# Patient Record
Sex: Male | Born: 1980 | Hispanic: No | Marital: Single | Smoking: Never smoker
Health system: Southern US, Community
[De-identification: ages and names within clinical notes are randomized; demographics above are authoritative.]

## PROBLEM LIST (undated history)

## (undated) DIAGNOSIS — G479 Sleep disorder, unspecified: Secondary | ICD-10-CM

## (undated) DIAGNOSIS — F209 Schizophrenia, unspecified: Secondary | ICD-10-CM

## (undated) DIAGNOSIS — F4310 Post-traumatic stress disorder, unspecified: Secondary | ICD-10-CM

---
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
## 2004-10-19 ENCOUNTER — Ambulatory Visit (HOSPITAL_COMMUNITY): Payer: Self-pay | Admitting: Psychiatry

## 2004-11-01 ENCOUNTER — Ambulatory Visit (HOSPITAL_COMMUNITY): Payer: Self-pay | Admitting: Psychiatry

## 2004-12-03 ENCOUNTER — Ambulatory Visit (HOSPITAL_COMMUNITY): Admission: RE | Admit: 2004-12-03 | Discharge: 2004-12-03 | Payer: Self-pay | Admitting: Psychiatry

## 2004-12-03 ENCOUNTER — Ambulatory Visit (HOSPITAL_COMMUNITY): Payer: Self-pay | Admitting: Psychiatry

## 2005-01-23 ENCOUNTER — Emergency Department (HOSPITAL_COMMUNITY): Admission: EM | Admit: 2005-01-23 | Discharge: 2005-01-23 | Payer: Self-pay | Admitting: Emergency Medicine

## 2005-01-28 ENCOUNTER — Ambulatory Visit (HOSPITAL_COMMUNITY): Payer: Self-pay | Admitting: Psychiatry

## 2005-07-05 ENCOUNTER — Ambulatory Visit (HOSPITAL_COMMUNITY): Payer: Self-pay | Admitting: Psychiatry

## 2005-08-05 ENCOUNTER — Ambulatory Visit (HOSPITAL_COMMUNITY): Payer: Self-pay | Admitting: Psychiatry

## 2005-08-19 ENCOUNTER — Emergency Department (HOSPITAL_COMMUNITY): Admission: EM | Admit: 2005-08-19 | Discharge: 2005-08-19 | Payer: Self-pay | Admitting: Emergency Medicine

## 2005-09-23 ENCOUNTER — Ambulatory Visit (HOSPITAL_COMMUNITY): Payer: Self-pay | Admitting: Psychiatry

## 2005-11-27 ENCOUNTER — Ambulatory Visit (HOSPITAL_COMMUNITY): Payer: Self-pay | Admitting: Psychiatry

## 2005-12-11 ENCOUNTER — Emergency Department (HOSPITAL_COMMUNITY): Admission: EM | Admit: 2005-12-11 | Discharge: 2005-12-11 | Payer: Self-pay | Admitting: Emergency Medicine

## 2006-02-19 ENCOUNTER — Ambulatory Visit (HOSPITAL_COMMUNITY): Payer: Self-pay | Admitting: Psychiatry

## 2006-05-07 ENCOUNTER — Ambulatory Visit (HOSPITAL_COMMUNITY): Payer: Self-pay | Admitting: Psychiatry

## 2006-07-11 ENCOUNTER — Ambulatory Visit (HOSPITAL_COMMUNITY): Payer: Self-pay | Admitting: Psychiatry

## 2006-10-22 ENCOUNTER — Ambulatory Visit (HOSPITAL_COMMUNITY): Payer: Self-pay | Admitting: Psychiatry

## 2006-11-05 ENCOUNTER — Ambulatory Visit (HOSPITAL_COMMUNITY): Payer: Self-pay | Admitting: Psychiatry

## 2007-04-22 ENCOUNTER — Ambulatory Visit (HOSPITAL_COMMUNITY): Payer: Self-pay | Admitting: Psychiatry

## 2007-05-20 ENCOUNTER — Ambulatory Visit (HOSPITAL_COMMUNITY): Payer: Self-pay | Admitting: Psychiatry

## 2007-07-13 ENCOUNTER — Ambulatory Visit (HOSPITAL_COMMUNITY): Payer: Self-pay | Admitting: Psychiatry

## 2007-08-10 ENCOUNTER — Ambulatory Visit (HOSPITAL_COMMUNITY): Payer: Self-pay | Admitting: Psychiatry

## 2007-10-09 ENCOUNTER — Ambulatory Visit (HOSPITAL_COMMUNITY): Payer: Self-pay | Admitting: Psychiatry

## 2008-02-24 ENCOUNTER — Ambulatory Visit (HOSPITAL_COMMUNITY): Payer: Self-pay | Admitting: Psychiatry

## 2008-09-30 ENCOUNTER — Ambulatory Visit (HOSPITAL_COMMUNITY): Payer: Self-pay | Admitting: Psychiatry

## 2009-03-03 ENCOUNTER — Ambulatory Visit (HOSPITAL_COMMUNITY): Payer: Self-pay | Admitting: Psychiatry

## 2010-01-03 ENCOUNTER — Ambulatory Visit (HOSPITAL_COMMUNITY): Payer: Self-pay | Admitting: Psychiatry

## 2010-03-09 ENCOUNTER — Encounter (HOSPITAL_COMMUNITY): Payer: Managed Care, Other (non HMO) | Admitting: Psychiatry

## 2010-03-09 DIAGNOSIS — F39 Unspecified mood [affective] disorder: Secondary | ICD-10-CM

## 2010-05-04 ENCOUNTER — Encounter (HOSPITAL_COMMUNITY): Payer: Managed Care, Other (non HMO) | Admitting: Psychiatry

## 2010-05-11 ENCOUNTER — Encounter (HOSPITAL_COMMUNITY): Payer: Managed Care, Other (non HMO) | Admitting: Psychiatry

## 2012-05-18 ENCOUNTER — Ambulatory Visit (HOSPITAL_COMMUNITY): Payer: Managed Care, Other (non HMO) | Admitting: Psychiatry

## 2013-05-19 ENCOUNTER — Ambulatory Visit (INDEPENDENT_AMBULATORY_CARE_PROVIDER_SITE_OTHER): Payer: Self-pay

## 2013-05-19 ENCOUNTER — Other Ambulatory Visit: Payer: Self-pay | Admitting: Family Medicine

## 2013-05-19 DIAGNOSIS — R52 Pain, unspecified: Secondary | ICD-10-CM

## 2013-05-19 DIAGNOSIS — M25519 Pain in unspecified shoulder: Secondary | ICD-10-CM

## 2014-10-23 IMAGING — CR DG SHOULDER 2+V*R*
3 series · 3 of 3 positions shown · non-contrast
Comparison: None.

CLINICAL DATA: Lifting injury 2 days ago with posterior shoulder
pain extending into the neck.

EXAM:
RIGHT SHOULDER - 2+ VIEW

[view not recorded (1 of 3)]
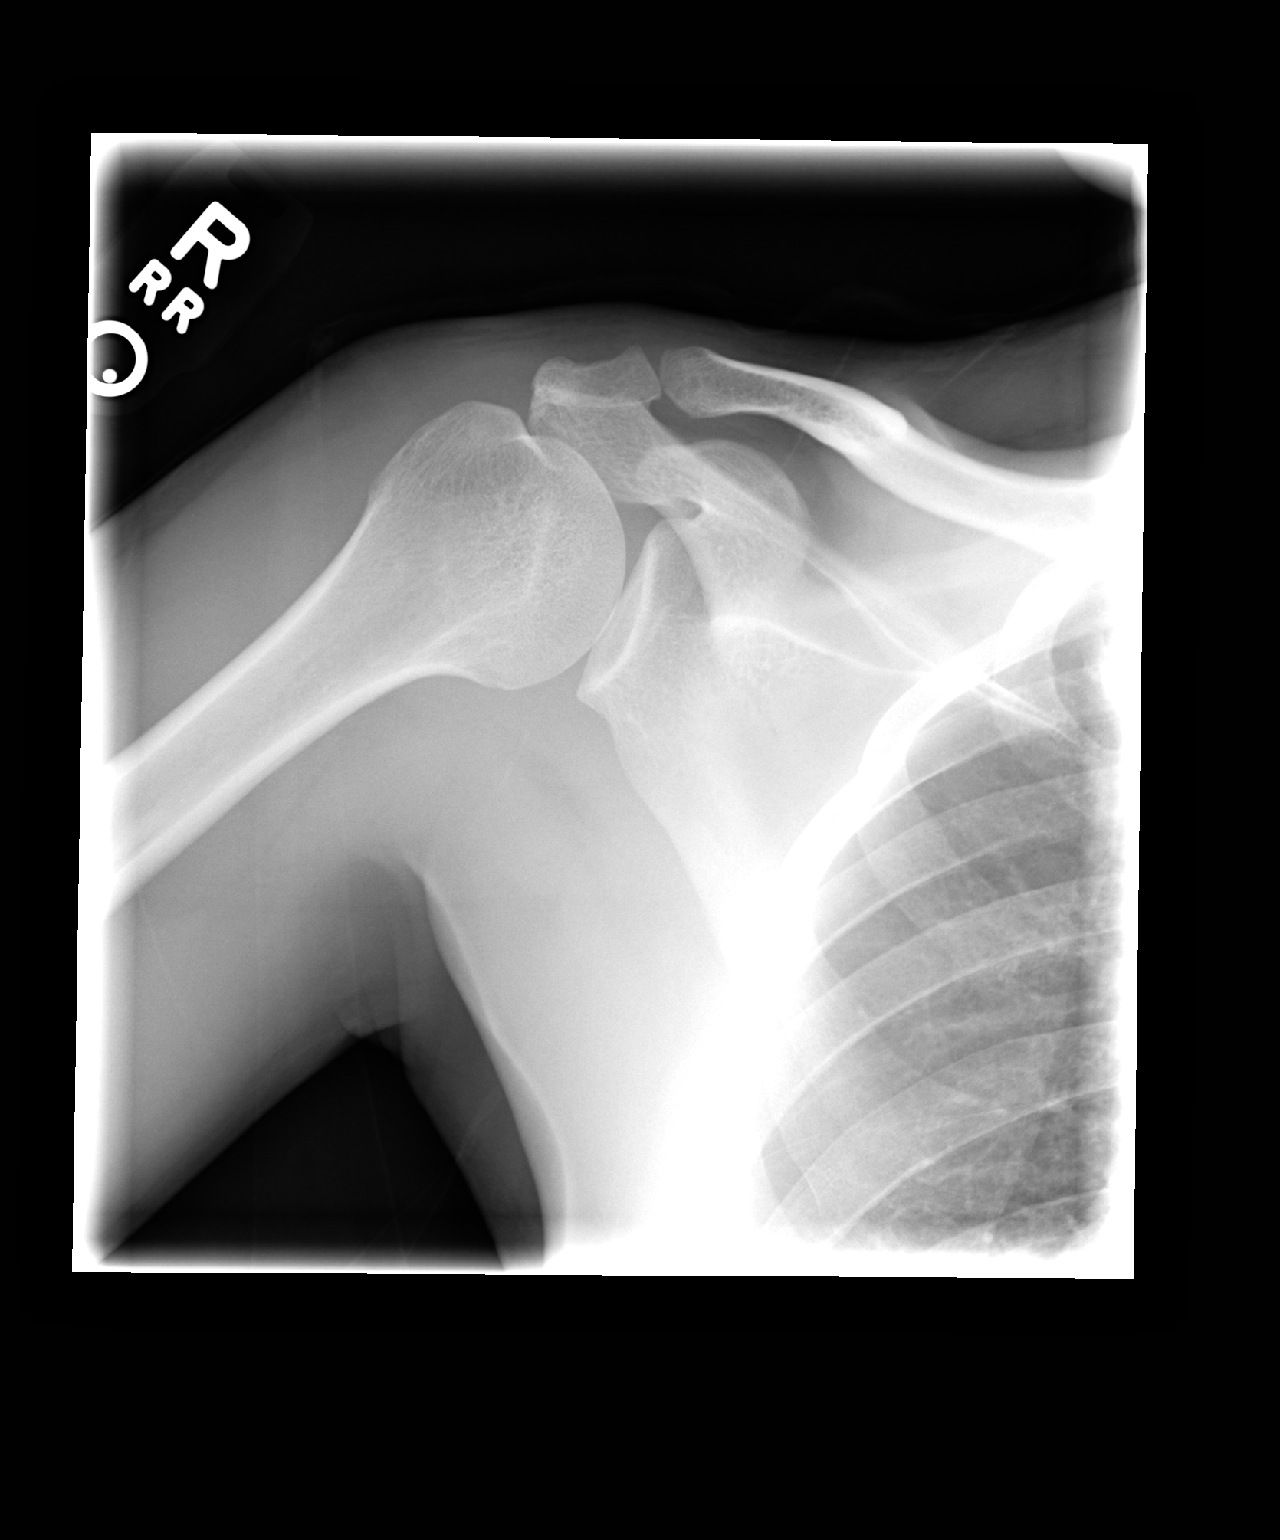

[view not recorded (2 of 3)]
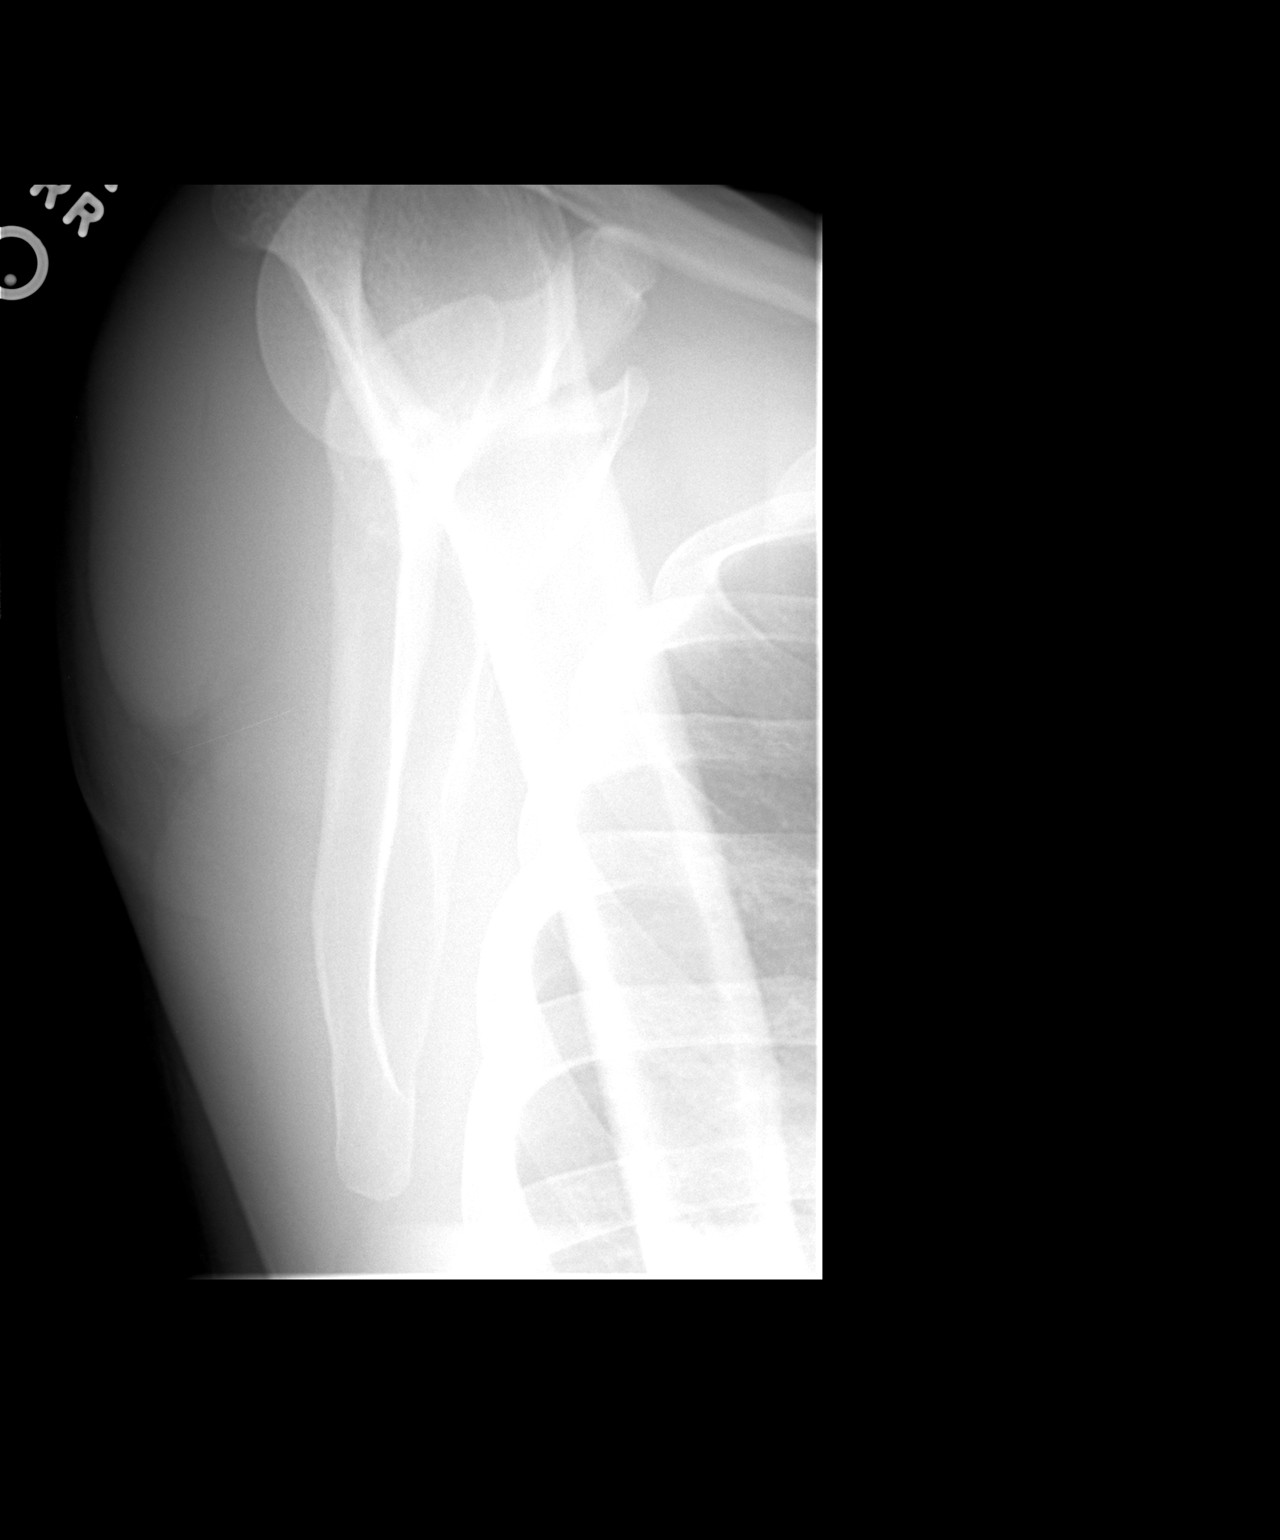

[view not recorded (3 of 3)]
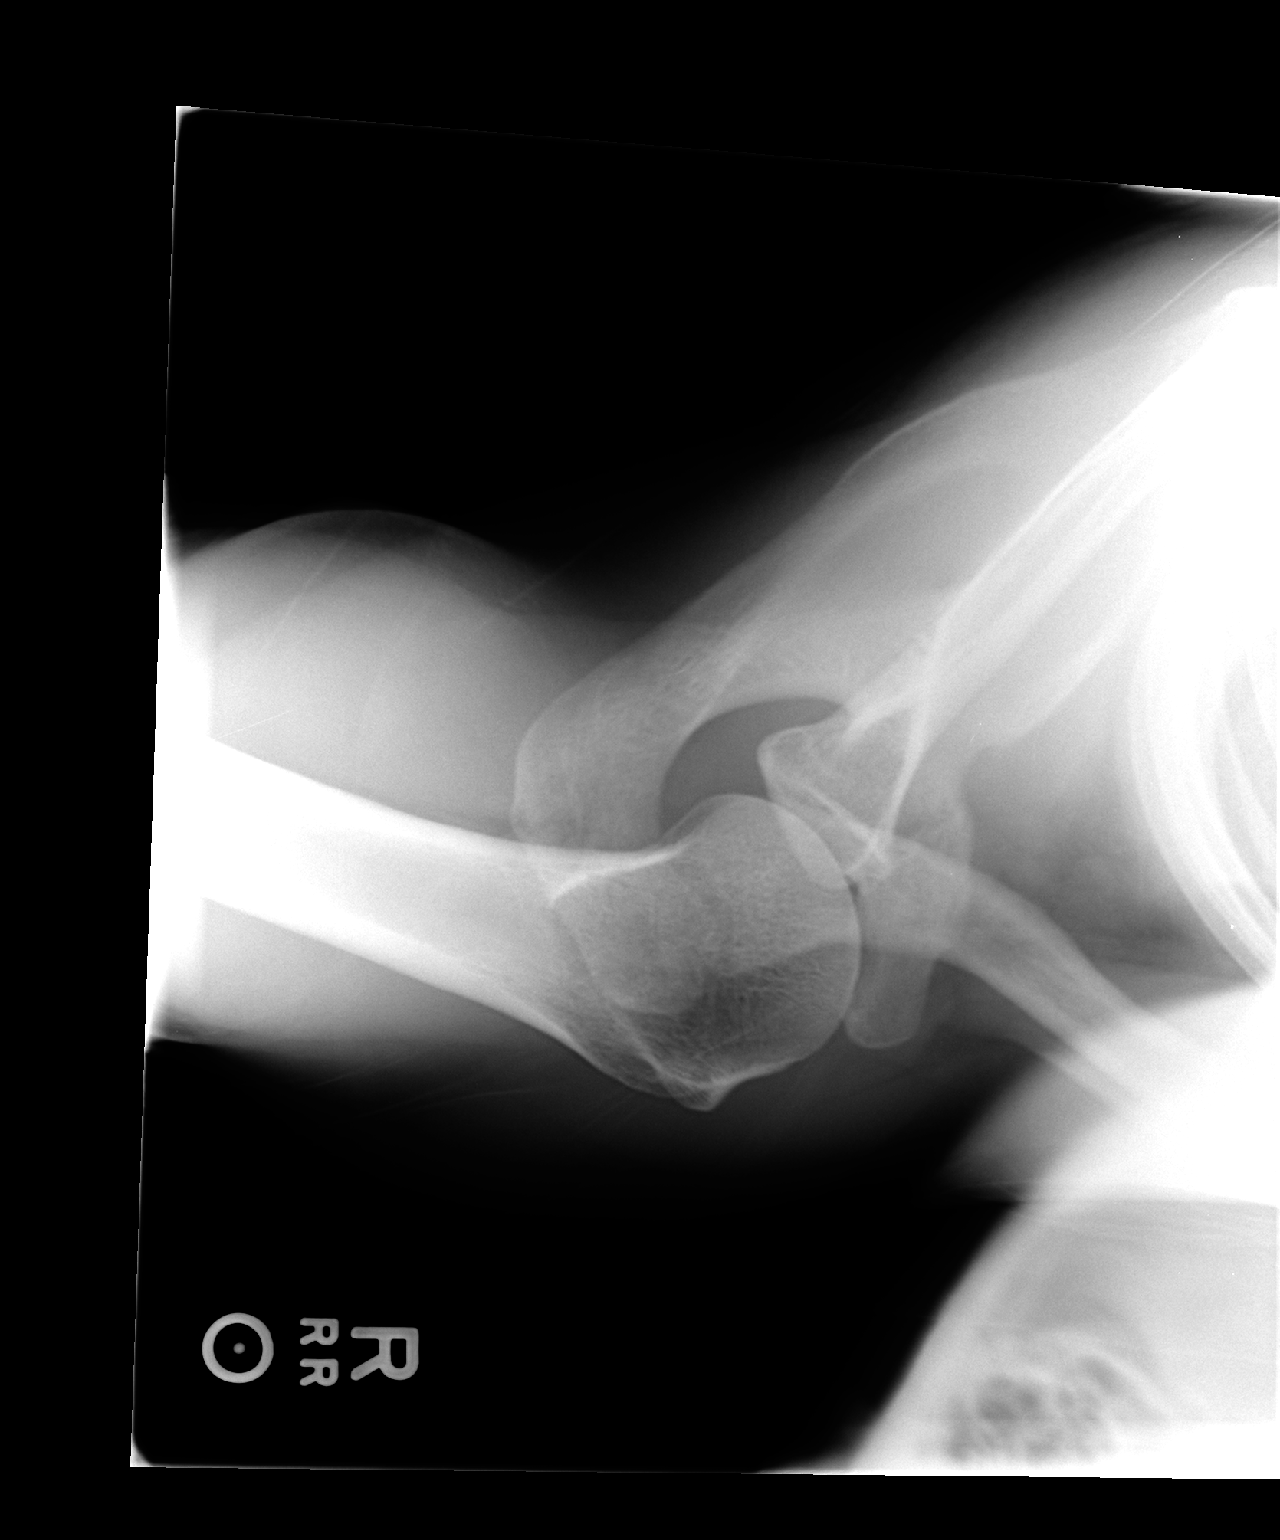

[3 of 3 positions shown; findings below may reference images not displayed]

FINDINGS: The mineralization and alignment are normal. There is no evidence of
acute fracture or dislocation. The subacromial space is not
optimally evaluated due to positioning. No significant arthropathic
changes are identified.
IMPRESSION: No acute osseous findings.

## 2015-04-03 ENCOUNTER — Inpatient Hospital Stay (HOSPITAL_COMMUNITY)
Admission: AD | Admit: 2015-04-03 | Discharge: 2015-04-06 | DRG: 885 | Disposition: A | Discharge status: Home / Self Care | Payer: Managed Care, Other (non HMO) | Attending: Psychiatry | Admitting: Psychiatry

## 2015-04-03 ENCOUNTER — Encounter (HOSPITAL_COMMUNITY): Payer: Self-pay

## 2015-04-03 DIAGNOSIS — G4700 Insomnia, unspecified: Secondary | ICD-10-CM | POA: Diagnosis present

## 2015-04-03 DIAGNOSIS — Z9114 Patient's other noncompliance with medication regimen: Secondary | ICD-10-CM

## 2015-04-03 DIAGNOSIS — F419 Anxiety disorder, unspecified: Secondary | ICD-10-CM | POA: Diagnosis present

## 2015-04-03 DIAGNOSIS — F201 Disorganized schizophrenia: Secondary | ICD-10-CM | POA: Diagnosis present

## 2015-04-03 DIAGNOSIS — F209 Schizophrenia, unspecified: Secondary | ICD-10-CM | POA: Diagnosis present

## 2015-04-03 MED ORDER — OLANZAPINE 5 MG PO TBDP
5.0000 mg | ORAL_TABLET | Freq: Three times a day (TID) | ORAL | Status: DC | PRN
  Administered 2015-04-03: 5 mg via ORAL
  Filled 2015-04-03 (×2): qty 1

## 2015-04-03 MED ORDER — MAGNESIUM HYDROXIDE 400 MG/5ML PO SUSP: 30.0000 mL | Freq: Every day | ORAL | Status: DC | PRN

## 2015-04-03 MED ORDER — TRAZODONE HCL 50 MG PO TABS
50.0000 mg | ORAL_TABLET | Freq: Every evening | ORAL | Status: DC | PRN
  Administered 2015-04-03: 50 mg via ORAL
  Filled 2015-04-03: qty 1

## 2015-04-03 MED ORDER — ACETAMINOPHEN 325 MG PO TABS: 650.0000 mg | ORAL_TABLET | Freq: Four times a day (QID) | ORAL | Status: DC | PRN

## 2015-04-03 MED ORDER — LORAZEPAM 0.5 MG PO TABS
0.5000 mg | ORAL_TABLET | Freq: Four times a day (QID) | ORAL | Status: DC | PRN
  Administered 2015-04-03: 0.5 mg via ORAL
  Filled 2015-04-03: qty 1

## 2015-04-03 MED ORDER — ALUM & MAG HYDROXIDE-SIMETH 200-200-20 MG/5ML PO SUSP: 30.0000 mL | ORAL | Status: DC | PRN

## 2015-04-03 NOTE — Progress Notes (Signed)
D: Pt A & O X 3.  Presents with blunt affect and anxious mood. Reported insomnia X 3 days and racing thoughts on initial approach. Cooperative with unit routines and care thus far. Off unit for supper and returned without issues.  A: PRN [facility] anxiety with verbal education. Encouragement offered towards treatment compliance. Availability and support provided to pt throughout this shift. Q 15 minutes checks maintained for safety without self harm gestures or outburst to report at this time.  R: Pt remains safe on and off unit. POC continues.
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
## 2015-04-03 NOTE — Tx Team (Signed)
Initial Interdisciplinary Treatment Plan   PATIENT STRESSORS: Medication change or noncompliance   PATIENT STRENGTHS: Supportive family/friends Work skills   PROBLEM LIST: Problem List/Patient Goals Date to be addressed Date deferred Reason deferred Estimated date of resolution  Paranoia/hallucinations 04/03/15     Sleep disturbance 04/03/15     Anxiety 04/03/15     "I want to sleep and eat better" 04/03/15     "I want to have my happy face back" 04/03/15                              DISCHARGE CRITERIA:  Need for constant or close observation no longer present Verbal commitment to aftercare and medication compliance  PRELIMINARY DISCHARGE PLAN: Outpatient therapy Medication management  PATIENT/FAMIILY INVOLVEMENT: This treatment plan has been presented to and reviewed with the patient, Brent [name].  The patient and family have been given the opportunity to ask questions and make suggestions.  Norm ParcelHeather V Pratik Dalziel 04/03/2015, 2:27 PM

## 2015-04-03 NOTE — BH Assessment (Signed)
Assessment Note  Brent [name] is an 35 y.o. male that reports seeing things that no one else can see.  Patient reports that he has not slept or had anything to eat in 3 days.  Patient reports that if he closes his eyes and then opens them that everything will be different.  Patient reports that things will look different and furniture will be moved.  Patient reports that if it is day when he closes his eye then it will be night when he opens his eyes.   Patient reports a previous psychiatric hospitalization at [facility].  Patient reports that he was diagnosed with Schizophrenia and his Psychiat[facility] ([facility].  Patient reports that he ran out of medication and was not able to get a refill because his Psychiatrist no longer works there.  Patient reports that he has not taken his psychiatric medication for 2 months.  Patient reports that he started feeling better and he decided that he did not need to take the medication any more.   Patient denies SI/HI/Substance Abuse.  Patient denies physical, sexual or emotional abuse.   Diagnosis: Schizophrenia  Past Medical History: No past medical history on file.  No past surgical history on file.  Family History: No family history on file.  Social History:  has no tobacco, alcohol, and drug history on file.  Additional Social History:  Alcohol / Drug Use History of alcohol / drug use?: No history of alcohol / drug abuse  CIWA:   COWS:    Allergies: Allergies not on file  Home Medications:  (Not in a hospital admission)  OB/GYN Status:  No LMP for male patient.  General Assessment Data Location of Assessment: [facility] Services ([facility] ) TTS Assessment: In system Is this a Tele or Face-to-Face Assessment?: Face-to-Face Is this an Initial Assessment or a Re-assessment for this encounter?: Initial Assessment Marital status: Married [place]iden name: NA Is patient pregnant?: No Pregnancy Status:  No Living Arrangements: Spouse/significant other Can pt return to current living arrangement?: Yes Admission Status: Voluntary Is patient capable of signing voluntary admission?: Yes Referral Source: Self/Family/Friend Insurance type: Designer, industrial/product[employer]etna  Medical Screening Exam [facility] Walk-in ONLY) Medical Exam completed:  ([facility])  Crisis Care Plan Living Arrangements: Spouse/significant other Legal Guardian:  (NA) Name of Psychiatrist: [facility] of Therapist: [facility]  Education Status Is patient currently in school?: No Current Grade: NA Highest grade of school patient has completed: NA Name of school: NA Contact person: NA  Risk to self with the past 6 months Suicidal Ideation: No Has patient been a risk to self within the past 6 months prior to admission? : No Suicidal Intent: No Has patient had any suicidal intent within the past 6 months prior to admission? : No Is patient at risk for suicide?: No Suicidal Plan?: No Has patient had any suicidal plan within the past 6 months prior to admission? : No Access to Means: No What has been your use of drugs/alcohol within the last 12 months?: NA Previous Attempts/Gestures: No How many times?: 0 Other Self Harm Risks: NA Triggers for Past Attempts: None known Intentional Self Injurious Behavior: None Family Suicide History: No Recent stressful life event(s):  (NA) Persecutory voices/beliefs?: No Depression: Yes Depression Symptoms: Despondent, Insomnia, Tearfulness, Isolating, Guilt, Fatigue, Loss of interest in usual pleasures, Feeling worthless/self pity Substance abuse history and/or treatment for substance abuse?: No Suicide prevention information given to non-admitted patients: Not  applicable  Risk to Others within the past 6 months Homicidal Ideation: No Does patient have any lifetime risk of violence toward others beyond the six months prior to admission? : No Thoughts of Harm to Others:  No Current Homicidal Intent: No Current Homicidal Plan: No Access to Homicidal Means: No Identified Victim: NA History of harm to others?: No Assessment of Violence: None Noted Violent Behavior Description: None Does patient have access to weapons?: No Criminal Charges Pending?: No Does patient have a court date: No Is patient on probation?: No  Psychosis Hallucinations: Visual Delusions: Grandiose  Mental Status Report Appearance/Hygiene: Disheveled Eye Contact: Poor Motor Activity: Freedom of movement, Restlessness Speech: Logical/coherent, Pressured Level of Consciousness: Alert, Quiet/awake, Restless Mood: Depressed, Anxious, Suspicious Affect: Anxious, Depressed, Blunted Anxiety Level: Minimal Thought Processes: Coherent, Relevant Judgement: Unimpaired Orientation: Person, Place, Time, Situation Obsessive Compulsive Thoughts/Behaviors: None  Cognitive Functioning Concentration: Decreased Memory: Remote Intact, Recent Intact IQ: Average Insight: Fair Impulse Control: Poor Appetite: Poor Weight Loss: 5 Weight Gain: 0 Sleep: Decreased Total Hours of Sleep: 1 (Has not slept in 3 days) Vegetative Symptoms: Decreased grooming, Not bathing, Staying in bed  ADLScreening [facility] Assessment Services) Patient's cognitive ability adequate to safely complete daily activities?: Yes Patient able to express need for assistance with ADLs?: Yes Independently performs ADLs?: Yes (appropriate for developmental age)  Prior Inpatient Therapy Prior Inpatient Therapy: Yes Prior Therapy Dates: 2016 Prior Therapy Facilty/Provider(s): [facility] Reason for Treatment: Psychosis  Prior Outpatient Therapy Prior Outpatient Therapy: Yes Prior Therapy Dates: Ongoing  Prior Therapy Facilty/Provider(s): [facility] Reason for Treatment: Medication Management and OPT Does patient have an ACCT team?: No Does patient have Intensive In-House Services?  : No Does patient have [facility]  services? : No Does patient have P4CC services?: No  ADL Screening (condition at time of admission) Patient's cognitive ability adequate to safely complete daily activities?: Yes Is the patient deaf or have difficulty hearing?: No Does the patient have difficulty seeing, even when wearing glasses/contacts?: No Does the patient have difficulty concentrating, remembering, or making decisions?: Yes Patient able to express need for assistance with ADLs?: Yes Does the patient have difficulty dressing or bathing?: No Independently performs ADLs?: Yes (appropriate for developmental age) Does the patient have difficulty walking or climbing stairs?: No Weakness of Legs: None Weakness of Arms/Hands: None  Home Assistive Devices/Equipment Home Assistive Devices/Equipment: None    Abuse/Neglect Assessment (Assessment to be complete while patient is alone) Physical Abuse: Denies Verbal Abuse: Denies Sexual Abuse: Denies Exploitation of patient/patient's resources: Denies Self-Neglect: Denies Values / Beliefs Cultural Requests During Hospitalization: None Spiritual Requests During Hospitalization: None Consults Spiritual Care Consult Needed: No Social Work Consult Needed: No Merchant navy officer (For Healthcare) Does patient have an advance directive?: No Would patient like information on creating an advanced directive?: No - patient declined information    Additional Information 1:1 In Past 12 Months?: No CIRT Risk: No Elopement Risk: No     Disposition: Per Vernona Rieger, patient meets criteria for inpatient hospitalization.  Per [facility] Inetta Fermo) patient accepted to [facility] Bed 502-2.  Disposition Initial Assessment Completed for this Encounter: Yes Disposition of Patient: Inpatient treatment program Type of inpatient treatment program: Adult  On Site Evaluation by:   Reviewed with Physician:    Phillip Heal LaVerne 04/03/2015 12:30 PM

## 2015-04-03 NOTE — Progress Notes (Signed)
Did not attend group 

## 2015-04-04 ENCOUNTER — Encounter (HOSPITAL_COMMUNITY): Payer: Self-pay | Admitting: Psychiatry

## 2015-04-04 DIAGNOSIS — F201 Disorganized schizophrenia: Principal | ICD-10-CM

## 2015-04-04 LAB — RAPID URINE DRUG SCREEN, HOSP PERFORMED
Amphetamines: NOT DETECTED
BARBITURATES: NOT DETECTED
Benzodiazepines: NOT DETECTED
Cocaine: NOT DETECTED
Opiates: NOT DETECTED
TETRAHYDROCANNABINOL: NOT DETECTED

## 2015-04-04 MED ORDER — ARIPIPRAZOLE 5 MG PO TABS
5.0000 mg | ORAL_TABLET | Freq: Every day | ORAL | Status: DC
  Administered 2015-04-04 – 2015-04-05 (×2): 5 mg via ORAL
  Filled 2015-04-04 (×3): qty 1

## 2015-04-04 MED ORDER — ENSURE ENLIVE PO LIQD: 237.0000 mL | Freq: Every day | ORAL | Status: DC | PRN

## 2015-04-04 MED ORDER — BENZTROPINE MESYLATE 0.5 MG PO TABS
0.5000 mg | ORAL_TABLET | Freq: Every day | ORAL | Status: DC
  Administered 2015-04-04 – 2015-04-05 (×2): 0.5 mg via ORAL
  Filled 2015-04-04 (×4): qty 1

## 2015-04-04 NOTE — [facility] Suicide Risk Assessment (Signed)
[facility] Admission Suicide Risk Assessment   Nursing information obtained from:    Demographic factors:    Current Mental Status:    Loss Factors:    Historical Factors:    Risk Reduction Factors:     Total Time spent with patient: 30 minutes Principal Problem: Schizophrenia (HCC) Diagnosis:   Patient Active Problem List   Diagnosis Date Noted  . Schizophrenia (HCC) [F20.9] 04/03/2015   Subjective Data: Patient states " I have sleep trouble and whenever I do not sleep , I have a lot of racing thoughts and I do things that I do not remember , like once I got in my car and drove all the way to [place] before realizing what I was doing and then checked myself in a hospital there."   Continued Clinical Symptoms:  Alcohol Use Disorder Identification Test Final Score (AUDIT): 0 The "Alcohol Use Disorders Identification Test", Guidelines for Use in Primary Care, Second Edition.  World Science writer [facility]). Score between 0-7:  no or low risk or alcohol related problems. Score between 8-15:  moderate risk of alcohol related problems. Score between 16-19:  high risk of alcohol related problems. Score 20 or above:  warrants further diagnostic evaluation for alcohol dependence and treatment.   CLINICAL FACTORS:   Schizophrenia:   Less than 8 years old   Musculoskeletal: Strength & Muscle Tone: within normal limits Gait & Station: normal Patient leans: N/A  Psychiatric Specialty Exam: Review of Systems  Psychiatric/Behavioral: The patient is nervous/anxious and has insomnia.   All other systems reviewed and are negative.   Blood pressure 94/56, pulse 117, temperature 98.4 F (36.9 C), temperature source Oral, resp. rate 16, height  (1.626 m), weight 56.7 kg (125 lb), SpO2 100 %.Body mass index is 21.45 kg/(m^2).  General Appearance: Fairly Groomed  Patent attorney::  Fair  Speech:  Normal Rate  Volume:  Normal  Mood:  Anxious  Affect:  Congruent  Thought Process:  Linear   Orientation:  Full (Time, Place, and Person)  Thought Content:  Rumination  Suicidal Thoughts:  No  Homicidal Thoughts:  No  Memory:  Immediate;   Fair Recent;   Fair Remote;   Fair  Judgement:  Fair  Insight:  Fair  Psychomotor Activity:  Normal  Concentration:  Poor  Recall:  [employer] of Knowledge:Fair  Language: Fair  Akathisia:  No  Handed:  Right  AIMS (if indicated):     Assets:  Desire for Improvement  Sleep:  Number of Hours: 6.75  Cognition: WNL  ADL's:  Intact    COGNITIVE FEATURES THAT CONTRIBUTE TO RISK:  Closed-mindedness, Polarized thinking and Thought constriction (tunnel vision)    SUICIDE RISK:   Mild:  Suicidal ideation of limited frequency, intensity, duration, and specificity.  There are no identifiable plans, no associated intent, mild dysphoria and related symptoms, good self-control (both objective and subjective assessment), few other risk factors, and identifiable protective factors, including available and accessible social support.  PLAN OF CARE: Patient will benefit from inpatient treatment and stabilization.  Estimated length of stay is 5-7 days.  Reviewed past medical records,treatment plan. Case discussed with May NP - please also see H&p. Will start a trial of Abilify 5 mg po qhs for mood sx. Will add Cogentin 0.5 mg po qhs for EPS. Will start Trazodone 100 mg po qhs for sleep. Will make available PRN medications as per agitation protocol. Will continue to monitor vitals ,medication compliance and treatment side effects while patient  is here.  Will monitor for medical issues as well as call consult as needed.  Reviewed labs ,will order basic labs as well as metabolic profile , UDS,EKG for qtc. CSW will start working on disposition.  Patient to participate in therapeutic milieu .       I certify that inpatient services furnished can reasonably be expected to improve the patient's condition.   Batool Majid, MD 04/04/2015, 1:31 PM

## 2015-04-04 NOTE — Progress Notes (Signed)
Patient ID: Brent [name], male   DOB: 04/27/1980, [age] y.o.   MRN: [id number] D  ---   Pt. Agrees to contract for safety and denies pain at this time.  He has isolated himself to his room most of day  And has no communication or interaction with others.  he did provide a urine sample today.  He refused to allow labs draws this AM , stating  " my blood is not right (at this time) ".    Another attemopt for labs will be made this evening.   Pt. Does not attend groups  Even though encouraged to do so.  Pt. Appears paranoid and suspicious of staff and peers.  ---  A ---   Support and encouragement provided.  --- R --  Pt. Remain safe on un it

## 2015-04-04 NOTE — Progress Notes (Signed)
NUTRITION ASSESSMENT  Pt identified as at risk on the Malnutrition Screen Tool  INTERVENTION: 1. Educated patient on the importance of nutrition and encouraged intake of food and beverages. 2. Discussed weight goals. 3. Supplements: will order Ensure Enlive once/day PRN, this supplement provides 350 kcal and 20 grams of protein   NUTRITION DIAGNOSIS: Unintentional weight loss related to sub-optimal intake as evidenced by pt report.   Goal: Pt to meet >/= 90% of their estimated nutrition needs.  Monitor:  PO intake  Assessment:  Pt admitted for anxiety, insomnia, and racing thoughts with paranoia. Pt has been eating well since admission and BMI WDL. No supplements warranted at this time but will order Ensure once/day PRN should pt need during admission.  35 y.o. male  Height: Ht Readings from Last 1 Encounters:  04/03/15 5\' 4"  (1.626 m)    Weight: Wt Readings from Last 1 Encounters:  04/03/15 125 lb (56.7 kg)    Weight Hx: Wt Readings from Last 10 Encounters:  04/03/15 125 lb (56.7 kg)    BMI:  Body mass index is 21.45 kg/(m^2). Pt meets criteria for normal weight based on current BMI.  Estimated Nutritional Needs: Kcal: 25-30 kcal/kg Protein: > 1 gram protein/kg Fluid: 1 ml/kcal  Diet Order: Diet regular Room service appropriate?: Yes; Fluid consistency:: Thin Pt is also offered choice of unit snacks mid-morning and mid-afternoon.  Pt is eating as desired.   Lab results and medications reviewed.     Trenton GammonJessica Anastashia Westerfeld, RD, LDN Inpatient Clinical Dietitian Pager # [phone] After hours/weekend pager # [phone]

## 2015-04-04 NOTE — Progress Notes (Signed)
Patient ID: [name], male   DOB: 03/17/1980, 35 y.o.   MRN: [id number]   D  ---    EKG ordered for 04/04/15 is done and posted in paper chart

## 2015-04-04 NOTE — [facility] Group Notes (Signed)
[facility] LCSW Group Therapy  04/04/2015 , 1:33 PM   Type of Therapy:  Group Therapy  Participation Level:  Active  Participation Quality:  Attentive  Affect:  Appropriate  Cognitive:  Alert  Insight:  Improving  Engagement in Therapy:  Engaged  Modes of Intervention:  Discussion, Exploration and Socialization  Summary of Progress/Problems: Today's group focused on the term Diagnosis.  Participants were asked to define the term, and then pronounce whether it is a negative, positive or neutral term.  Came late, then was called out to meet with provider.  Little time was spent in group.  While there, stated he had not been sleeping or eating at all, which is the reason for his hospitalization.  Brent [name], Brent [name] 04/04/2015 , 1:33 PM

## 2015-04-04 NOTE — [facility] Group Notes (Signed)
[facility] Group Notes:  (Nursing/MHT/Case Management/Adjunct)  Date:  04/04/2015  Time:  3:17 PM  Type of Therapy:  Psychoeducational Skills  Participation Level:  Did Not Attend  Participation Quality:  N/A  Affect:  N/A  Cognitive:  N/A  Insight:  None  Engagement in Group:  None  Modes of Intervention:  N/A  Summary of Progress/Problems:   Pt. Did not attend AM group  Arsenio LoaderHiatt, Jacky Hartung Dudley 04/04/2015, 3:17 PM

## 2015-04-04 NOTE — Progress Notes (Signed)
D. Pt had been in his room and in bed for much of the evening. Pt spoke about how he has been feeling anxious and not sleeping and having racing thoughts and discussed feelings of paranoia. Pt requested and received medications for sleep and to help with racing thoughts and was also provided snack and fluids and asked if these could be brought to his room as he felt he could not make it to the medication room on his own. A. Support provided, and medications administered. R. Safety maintained, will continue to monitor.

## 2015-04-04 NOTE — H&P (Signed)
Psychiatric Admission Assessment Adult  Patient Identification: Brent [name] MRN:  [id number] Date of Evaluation:  04/04/2015 Chief Complaint:  SCHIZOPHRENIA Principal Diagnosis: Schizophrenia (HCC) Diagnosis:   Patient Active Problem List   Diagnosis Date Noted  . Schizophrenia (HCC) [F20.9] 04/03/2015    Priority: High   History of Present Illness:  Brent [name], 35 y.o. Male reports that he has not slept or had anything to eat in 3 days. His wife was concerned and took him to the hospital.  He states that he is orginally from [place] and came [place].  "It [place] in [place].  I work hard, run, run, all the time.  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  He has also been non compliant on his meds.  He does not remember the name of his medications.  He reports that there are a lot of things on his head.    Per chart review, patient did report a previous psychiatric hospitalization at [facility]. Chart records reveal that he has a diagnosis of  Schizophrenia and being managed in the outpatient setting by  Psychiatrist (Dr. Tomasa Randunningham[facility]; that he ran out of medication and was not able to get a refill because his Psychiatrist no longer works there and that he has not taken his psychiatric medication for 2 months.   Patient denies SI/HI/Substance Abuse. Patient denies physical, sexual or emotional abuse.   Associated Signs/Symptoms: Depression Symptoms:  depressed mood, hopelessness, anxiety, (Hypo) Manic Symptoms:  Irritable Mood, Labiality of Mood, Anxiety Symptoms:  Social Anxiety, Psychotic Symptoms:  History  PTSD Symptoms: NA Total Time spent with patient: 30 minutes  Past Psychiatric History: see above noted  Is the patient at risk to self? Yes.    Has the patient been a risk to self in the past 6 months? Yes.    Has the patient been a  risk to self within the distant past? No.  Is the patient a risk to others? No.  Has the patient been a risk to others in the past 6 months? No.  Has the patient been a risk to others within the distant past? No.   Prior Inpatient Therapy: Prior Inpatient Therapy: Yes Prior Therapy Dates: 2016 Prior Therapy Facilty/Provider([facility] Reason for Treatment: Psychosis Prior Outpatient Therapy: Prior Outpatient Therapy: Yes Prior Therapy Dates: Ongoing  Prior Therapy Facilty/Provider(s): [facility] Reason for Treatment: Medication Management and OPT Does patient have an ACCT team?: No Does patient have Intensive In-House Services?  : No Does patient have [facility] services? : No Does patient have P4CC services?: No  Alcohol Screening: 1. How often do you have a drink containing alcohol?: Never 9. Have you or someone else been injured as a result of your drinking?: No 10. Has a relative or friend or a doctor or another health worker been concerned about your drinking or suggested you cut down?: No Alcohol Use Disorder Identification Test Final Score (AUDIT): 0 Brief Intervention: AUDIT score less than 7 or less-screening does not suggest unhealthy drinking-brief intervention not indicated Substance Abuse History in the last 12 months:  Yes.   Consequences of Substance Abuse: NA Previous Psychotropic Medications: Yes  Psychological Evaluations: Yes  Past Medical History: History reviewed. No pertinent past medical history. History reviewed. No pertinent past surgical history. Family History: History reviewed. No pertinent family  history. Family Psychiatric  History: denied Tobacco Screening: none Social History:  History  Alcohol Use: Not on file     History  Drug Use Not on file    Additional Social History: Marital status: Married, no children    History of alcohol / drug use?: No history of alcohol / drug abuse    Allergies:  No Known Allergies Lab Results: No  results found for this or any previous visit (from the past 48 hour(s)).  Blood Alcohol level:  No results found for: [facility]  Metabolic Disorder Labs:  No results found for: HGBA1C, MPG No results found for: PROLACTIN No results found for: CHOL, TRIG, HDL, CHOLHDL, VLDL, LDLCALC  Current Medications: Current Facility-Administered Medications  Medication Dose Route Frequency Provider Last Rate Last Dose  . acetaminophen (TYLENOL) tablet 650 mg  650 mg Oral Q6H PRN Thermon Leyland, NP      . alum & mag hydroxide-simeth (MAALOX/MYLANTA) 200-200-20 MG/5ML suspension 30 mL  30 mL Oral Q4H PRN Thermon Leyland, NP      . ARIPiprazole (ABILIFY) tablet 5 mg  5 mg Oral QHS Saramma Eappen, MD      . benztropine (COGENTIN) tablet 0.5 mg  0.5 mg Oral QHS Saramma Eappen, MD      . feeding supplement (ENSURE ENLIVE) (ENSURE ENLIVE) liquid 237 mL  237 mL Oral Daily PRN Jomarie Longs, MD      . LORazepam (ATIVAN) tablet 0.5 mg  0.5 mg Oral Q6H PRN Thermon Leyland, NP   0.5 mg at 04/03/15 1659  . magnesium hydroxide (MILK OF MAGNESIA) suspension 30 mL  30 mL Oral Daily PRN Thermon Leyland, NP      . OLANZapine zydis (ZYPREXA) disintegrating tablet 5 mg  5 mg Oral Q8H PRN Thermon Leyland, NP   5 mg at 04/03/15 2143  . traZODone (DESYREL) tablet 50 mg  50 mg Oral QHS PRN Thermon Leyland, NP   50 mg at 04/03/15 2143   PTA Medications: Prescriptions prior to admission  Medication Sig Dispense Refill Last Dose  . acetaminophen (TYLENOL) 500 MG tablet Take 1,000 mg by mouth every 6 (six) hours as needed for headache.   03/31/2015  . doxylamine, Sleep, (SLEEP AID) 25 MG tablet Take 50 mg by mouth at bedtime as needed for sleep.   04/02/2015    Musculoskeletal: Strength & Muscle Tone: within normal limits Gait & Station: normal Patient leans: N/A  Psychiatric Specialty Exam: Physical Exam  Vitals reviewed.   Review of Systems  Psychiatric/Behavioral: Positive for depression. The patient is nervous/anxious and has  insomnia.   All other systems reviewed and are negative.   Blood pressure 94/56, pulse 117, temperature 98.4 F (36.9 C), temperature source Oral, resp. rate 16, height  (1.626 m), weight 56.7 kg (125 lb), SpO2 100 %.Body mass index is 21.45 kg/(m^2).  General Appearance: Disheveled  Eye Contact::  Good  Speech:  Clear and Coherent  Volume:  Normal  Mood:  Anxious and Depressed  Affect:  Congruent and Flat  Thought Process:  Coherent  Orientation:  Full (Time, Place, and Person)  Thought Content:  Rumination  Suicidal Thoughts:  No  Homicidal Thoughts:  No  Memory:  Immediate;   Fair Recent;   Fair Remote;   Fair  Judgement:  Impaired  Insight:  Fair  Psychomotor Activity:  Normal  Concentration:  Fair  Recall:  [employer] of Knowledge:Fair  Language: Fair  Akathisia:  Negative  Handed:  Right  AIMS (if indicated):     Assets:  Resilience Social Support  ADL's:  Intact  Cognition: WNL  Sleep:  Number of Hours: 6.75   Treatment Plan Summary: Admit for crisis management and mood stabilization. Medication management to re-stabilize current mood symptoms Group counseling sessions for coping skills Medical consults as needed Review and reinstate any pertinent home medications for other health problems   Observation Level/Precautions:  15 minute checks  Laboratory:  per ED  Psychotherapy:  group  Medications:  See medlist  Consultations:  As needed  Discharge Concerns:  safety  Estimated LOS:  Other:     I certify that inpatient services furnished can reasonably be expected to improve the patient's condition.    Lindwood Qua, NP [facility] 3/21/201711:58 AM

## 2015-04-05 LAB — CBC WITH DIFFERENTIAL/PLATELET
BASOS ABS: 0 10*3/uL (ref 0.0–0.1)
Basophils Relative: 1 %
EOS PCT: 5 %
Eosinophils Absolute: 0.3 10*3/uL (ref 0.0–0.7)
HCT: 41.6 % (ref 39.0–52.0)
HEMOGLOBIN: 13.9 g/dL (ref 13.0–17.0)
LYMPHS PCT: 36 %
Lymphs Abs: 2 10*3/uL (ref 0.7–4.0)
MCH: 30.2 pg (ref 26.0–34.0)
MCHC: 33.4 g/dL (ref 30.0–36.0)
MCV: 90.4 fL (ref 78.0–100.0)
Monocytes Absolute: 0.5 10*3/uL (ref 0.1–1.0)
Monocytes Relative: 8 %
NEUTROS PCT: 50 %
Neutro Abs: 2.7 10*3/uL (ref 1.7–7.7)
PLATELETS: 196 10*3/uL (ref 150–400)
RBC: 4.6 MIL/uL (ref 4.22–5.81)
RDW: 12.4 % (ref 11.5–15.5)
WBC: 5.5 10*3/uL (ref 4.0–10.5)

## 2015-04-05 LAB — COMPREHENSIVE METABOLIC PANEL
ALT: 13 U/L — AB (ref 17–63)
AST: 15 U/L (ref 15–41)
Albumin: 4.6 g/dL (ref 3.5–5.0)
Alkaline Phosphatase: 51 U/L (ref 38–126)
Anion gap: 9 (ref 5–15)
BILIRUBIN TOTAL: 0.9 mg/dL (ref 0.3–1.2)
BUN: 20 mg/dL (ref 6–20)
CALCIUM: 9.4 mg/dL (ref 8.9–10.3)
CO2: 27 mmol/L (ref 22–32)
CREATININE: 0.76 mg/dL (ref 0.61–1.24)
Chloride: 106 mmol/L (ref 101–111)
GFR calc Af Amer: >60 mL/min (ref >60)
Glucose, Bld: 102 mg/dL — ABNORMAL HIGH (ref 65–99)
Potassium: 4 mmol/L (ref 3.5–5.1)
Sodium: 142 mmol/L (ref 135–145)
TOTAL PROTEIN: 7.1 g/dL (ref 6.5–8.1)

## 2015-04-05 LAB — ETHANOL

## 2015-04-05 LAB — LIPID PANEL
CHOL/HDL RATIO: 4 ratio
CHOLESTEROL: 173 mg/dL (ref 0–200)
HDL: 43 mg/dL (ref >40)
LDL Cholesterol: 95 mg/dL (ref 0–99)
Triglycerides: 174 mg/dL — ABNORMAL HIGH (ref <150)
VLDL: 35 mg/dL (ref 0–40)

## 2015-04-05 LAB — TSH: TSH: 0.393 u[IU]/mL (ref 0.350–4.500)

## 2015-04-05 MED ORDER — ARIPIPRAZOLE ER 400 MG IM SUSR
400.0000 mg | INTRAMUSCULAR | Status: DC
  Administered 2015-04-06: 400 mg via INTRAMUSCULAR

## 2015-04-05 NOTE — BHH Counselor (Signed)
Adult Comprehensive Assessment  Patient ID: Charleston Pootbuu A Coulon, male   DOB: 06/10/1980, 35 y.o.   MRN: [id number]  Information Source: Information source: Patient  Current Stressors:  Employment / Job issues: Working two jobs.  "All I do is work, work, work." Family Relationships: States his whole family lives here, as well as wife's best friend. Financial / Lack of resources (include bankruptcy): States that finances are tight.  Living/Environment/Situation:  Living Arrangements: Spouse/significant other (married 5 years) Living conditions (as described by patient or guardian): good How long has patient lived in current situation?: 3 yearsa What is atmosphere in current home: Comfortable, Supportive, Loving  Family History:  Marital status: Married Number of Years Married: 5 What types of issues is patient dealing with in the relationship?: none Are you sexually active?: Yes What is your sexual orientation?: hetero Does patient have children?: No  Childhood History:  By whom was/is the patient raised?: Both parents Description of patient's relationship with caregiver when they were a child: good Patient's description of current relationship with people who raised him/her: good Does patient have siblings?: Yes Number of Siblings: 9 Description of patient's current relationship with siblings: "I'm the oldest one.  I get along well with everytone." Did patient suffer any verbal/emotional/physical/sexual abuse as a child?: Yes ("Many bad things were happening in [place], which is why we came [place] the states.") Did patient suffer from severe childhood neglect?: No Has patient ever been sexually abused/assaulted/raped as an adolescent or adult?: No Was the patient ever a victim of a crime or a disaster?: No Witnessed domestic violence?: No Has patient been effected by domestic violence as an adult?: No  Education:  Highest grade of school patient has completed: 12 from [employer]  [employer] Currently a student?: No Name of school: NA Contact person: NA Learning disability?: No  Employment/Work Situation:   Employment situation: Employed Where is patient currently employed?: [employer] How long has patient been employed?: 13 years Patient's job has been impacted by current illness: Yes Describe how patient's job has been impacted: "Was not able to sleep, so unable to focus." What is the longest time patient has a held a job?: see above Where was the patient employed at that time?: see above Has patient ever been in the [employer] and Companymilitary?: No Are There Guns or Other Weapons in Your Home?: No  Financial Resources:   Financial resources: Income from employment Does patient have a representative payee or guardian?: No  Alcohol/Substance Abuse:   What has been your use of drugs/alcohol within the last 12 months?: denies Alcohol/Substance Abuse Treatment Hx: Denies past history Has alcohol/substance abuse ever caused legal problems?: No  Social Support System:   Patient's Community Support System: Good Describe Community Support System: wife, her friends, my family Type of faith/religion: Muslim How does patient's faith help to cope with current illness?: I pray every day.  It takes away bad things-like stress.  Leisure/Recreation:   Leisure and Hobbies: play soccer,play basketball  Strengths/Needs:   What things does the patient do well?: sports, responsible In what areas does patient struggle / problems for patient: "every morning getting up to get to work"  Discharge Plan:   Does patient have access to transportation?: Yes Will patient be returning to same living situation after discharge?: Yes Currently receiving community mental health services: No If no, would patient like referral for services when discharged?: Yes (What county?) Medical sales representative([place]) Does patient have financial barriers related to discharge medications?: No  Summary/Recommendations:   Summary  and Recommendations (to be completed by the evaluator): Ayodeji is a 35 YO married [place]-American  who is diagnosed with Schizophrenia.  He had not been taking medications for sometime, and began experiencing insomnia and racing thoughts.  He is willing to continue to take medication and see an outpatient psychiatrist.  He can benefit from crises stabilization, medication management, therapeutic milieu and referral for services.  Daryel Gerald B. 04/05/2015

## 2015-04-05 NOTE — Tx Team (Signed)
Interdisciplinary Treatment Plan Update (Adult)  Date:  04/05/2015   Time Reviewed:  3:43 PM   Progress in Treatment: Attending groups: Yes. Participating in groups:  Yes. Taking medication as prescribed:  Yes. Tolerating medication:  Yes. Family/Significant other contact made:  Yes Patient understands diagnosis:  Yes  As evidenced by seeking help with "insomnia and racing thoughts" Discussing patient identified problems/goals with staff:  Yes, see initial care plan. Medical problems stabilized or resolved:  Yes. Denies suicidal/homicidal ideation: Yes. Issues/concerns per patient self-inventory:  No. Other:  New problem(s) identified:  Discharge Plan or Barriers: see below  Reason for Continuation of Hospitalization: Medication stabilization Other; describe Racing thoughts, insomnia  Comments:  Brent [name] is an 35 y.o. male that reports seeing things that no one else can see. Patient reports that he has not slept or had anything to eat in 3 days. Patient reports that if he closes his eyes and then opens them that everything will be different. Patient reports that things will look different and furniture will be moved. Patient reports that if it is day when he closes his eye then it will be night when he opens his eyes.  Abilify trial  Estimated length of stay:  2-4 days  New goal(s):  Review of initial/current patient goals per problem list:   Review of initial/current patient goals per problem list:  1. Goal(s): Patient will participate in aftercare plan   Met: Yes   Target date: 3-5 days post admission date   As evidenced by: Patient will participate within aftercare plan AEB aftercare provider and housing plan at discharge being identified. 04/05/15:  Return home, follow up outpt    5. Goal(s): Patient will demonstrate decreased signs of psychosis  * Met: No  * Target date: 3-5 days post admission date  * As evidenced by: Patient will demonstrate  decreased frequency of AVH or return to baseline function 04/05/15:  Pt was not taking meds prior to admission.  C/O insomnia and racing thoughts          Attendees: Patient:  04/05/2015 3:43 PM   Family:   04/05/2015 3:43 PM   Physician:  Ursula Alert, MD 04/05/2015 3:43 PM   Nursing:   Gaylan Gerold, RN 04/05/2015 3:43 PM   CSW:    Roque Lias, LCSW   04/05/2015 3:43 PM   Other:  04/05/2015 3:43 PM   Other:   04/05/2015 3:43 PM   Other:  Lars Pinks, Nurse CM 04/05/2015 3:43 PM   Other:   04/05/2015 3:43 PM   Other:  Norberto Sorenson, [place]  04/05/2015 3:43 PM   Other:  04/05/2015 3:43 PM   Other:  04/05/2015 3:43 PM   Other:  04/05/2015 3:43 PM   Other:  04/05/2015 3:43 PM   Other:  04/05/2015 3:43 PM   Other:   04/05/2015 3:43 PM    Scribe for Treatment Team:   Trish Mage, 04/05/2015 3:43 PM

## 2015-04-05 NOTE — BHH Suicide Risk Assessment (Signed)
B[facility] INPATIENT:  Family/Significant Other Suicide Prevention Education  Suicide Prevention Education:  Education Completed; No one has been identified by the patient as the family member/significant other with whom the patient will be residing, and identified as the person(s) who will aid the patient in the event of a mental health crisis (suicidal ideations/suicide attempt).  With written consent from the patient, the family member/significant other has been provided the following suicide prevention education, prior to the and/or following the discharge of the patient.  The suicide prevention education provided includes the following:  Suicide risk factors  Suicide prevention and interventions  National Suicide Hotline telephone number  [facility] assessment telephone number  [facility] Emergency Assistance 911  [facility] and/or Residential Mobile Crisis Unit telephone number  Request made of family/significant other to:  Remove weapons (e.g., guns, rifles, knives), all items previously/currently identified as safety concern.    Remove drugs/medications (over-the-counter, prescriptions, illicit drugs), all items previously/currently identified as a safety concern.  The family member/significant other verbalizes understanding of the suicide prevention education information provided.  The family member/significant other agrees to remove the items of safety concern listed above. The patient did not endorse SI at the time of admission, nor did the patient c/o SI during the stay here.  SPE not required.   Daryel Geraldorth, Keiandra Sullenger B 04/05/2015, 3:53 PM

## 2015-04-05 NOTE — Progress Notes (Signed)
D. Pt had been up and visible in milieu this evening, seen interacting with peers. Pt spoke about how he is still having some feelings of paranoia but reports he feels more relaxed today and feels better today than yesterday and spoke about how he slept ok last night and feels that really was a benefit to him. Pt did receive all bedtime medications without incident and did not verbalize any complaints of pain. A. Support and encouragement provided. R. Safety maintained, will continue to monitor.

## 2015-04-05 NOTE — Progress Notes (Signed)
[facility] MD Progress Note  04/05/2015 2:19 PM Brent [name]  MRN:  [id number] Subjective: Patient states " I feel fine.'  Objective:Brent [name] is a 35 y.o.AA male ,who is married , employed, [place] , has a hx of schizophrenia , who presented with sleep issues and racing thoughts.  Patient seen and chart reviewed.Discussed patient with treatment team.  Pt seems to be tolerating medications well, denies ADRS. Pt reports anxiety, sleep as improving. Per staff - pt is less paranoid, less disorganized, has had no disruptive issues noted on the unit.      Principal Problem: Schizophrenia ([place]) Diagnosis:   Patient Active Problem List   Diagnosis Date Noted  . Schizophrenia ([place]) [F20.9] 04/03/2015   Total Time spent with patient: 20 minutes  Past Psychiatric History: Pt reports hx of schizophrenia, was being followed by Dr.Cunningham in the past. Was admitted once in a hospital in [place] 3 years ago. Pt reports he currently does not follow up with anyone.  Past Medical History:  Pt denies hx of dm, htn, thyroid disease. Family History: Denies hx of HTN, DM, heart disease , thyroid disease in family. Family History  Problem Relation Age of Onset  . Mental illness Neg Hx    Family Psychiatric  History: denies hx of mental illness. Social History: Is married, employed , denies having children. History  Alcohol Use No     History  Drug Use Not on file    Social History   Social History  . Marital Status: Single    Spouse Name: N/A  . Number of Children: N/A  . Years of Education: N/A   Social History Main Topics  . Smoking status: Never Smoker   . Smokeless tobacco: None  . Alcohol Use: No  . Drug Use: None  . Sexual Activity: Not Asked   Other Topics Concern  . None   Social History Narrative   Additional Social History:    History of alcohol / drug use?: No history of alcohol / drug abuse                    Sleep: improving  Appetite:   Fair  Current Medications: Current Facility-Administered Medications  Medication Dose Route Frequency Provider Last Rate Last Dose  . acetaminophen (TYLENOL) tablet 650 mg  650 mg Oral Q6H PRN Niel Hummer, NP      . alum & mag hydroxide-simeth (MAALOX/MYLANTA) 200-200-20 MG/5ML suspension 30 mL  30 mL Oral Q4H PRN Niel Hummer, NP      . ARIPiprazole (ABILIFY) tablet 5 mg  5 mg Oral QHS Ursula Alert, MD   5 mg at 04/04/15 2024  . [START ON 04/06/2015] ARIPiprazole SUSR 400 mg  400 mg Intramuscular Q30 days Ursula Alert, MD      . benztropine (COGENTIN) tablet 0.5 mg  0.5 mg Oral QHS Ursula Alert, MD   0.5 mg at 04/04/15 2024  . feeding supplement (ENSURE ENLIVE) (ENSURE ENLIVE) liquid 237 mL  237 mL Oral Daily PRN Ursula Alert, MD      . LORazepam (ATIVAN) tablet 0.5 mg  0.5 mg Oral Q6H PRN Niel Hummer, NP   0.5 mg at 04/03/15 1659  . magnesium hydroxide (MILK OF MAGNESIA) suspension 30 mL  30 mL Oral Daily PRN Niel Hummer, NP      . OLANZapine zydis (ZYPREXA) disintegrating tablet 5 mg  5 mg Oral Q8H PRN Niel Hummer, NP   5  mg at 04/03/15 2143  . traZODone (DESYREL) tablet 50 mg  50 mg Oral QHS PRN Niel Hummer, NP   50 mg at 04/03/15 2143    Lab Results:  Results for orders placed or performed during the hospital encounter of 04/03/15 (from the past 48 hour(s))  Urine rapid drug screen (hosp performed)not at [facility]     Status: None   Collection Time: 04/04/15  2:39 PM  Result Value Ref Range   Opiates NONE DETECTED NONE DETECTED   Cocaine NONE DETECTED NONE DETECTED   Benzodiazepines NONE DETECTED NONE DETECTED   Amphetamines NONE DETECTED NONE DETECTED   Tetrahydrocannabinol NONE DETECTED NONE DETECTED   Barbiturates NONE DETECTED NONE DETECTED    Comment:        DRUG SCREEN FOR MEDICAL PURPOSES ONLY.  IF CONFIRMATION IS NEEDED FOR ANY PURPOSE, NOTIFY LAB WITHIN 5 DAYS.        LOWEST DETECTABLE LIMITS FOR URINE DRUG SCREEN Drug Class       Cutoff  (ng/mL) Amphetamine      1000 Barbiturate      200 Benzodiazepine   836 Tricyclics       629 Opiates          300 Cocaine          300 THC              50 Performed at [facility]   CBC with Differential/Platelet     Status: None   Collection Time: 04/05/15  6:35 AM  Result Value Ref Range   WBC 5.5 4.0 - 10.5 K/uL   RBC 4.60 4.22 - 5.81 MIL/uL   Hemoglobin 13.9 13.0 - 17.0 g/dL   HCT 41.6 39.0 - 52.0 %   MCV 90.4 78.0 - 100.0 fL   MCH 30.2 26.0 - 34.0 pg   MCHC 33.4 30.0 - 36.0 g/dL   RDW 12.4 11.5 - 15.5 %   Platelets 196 150 - 400 K/uL   Neutrophils Relative % 50 %   Neutro Abs 2.7 1.7 - 7.7 K/uL   Lymphocytes Relative 36 %   Lymphs Abs 2.0 0.7 - 4.0 K/uL   Monocytes Relative 8 %   Monocytes Absolute 0.5 0.1 - 1.0 K/uL   Eosinophils Relative 5 %   Eosinophils Absolute 0.3 0.0 - 0.7 K/uL   Basophils Relative 1 %   Basophils Absolute 0.0 0.0 - 0.1 K/uL    Comment: Performed at [facility]  Lipid panel     Status: Abnormal   Collection Time: 04/05/15  6:35 AM  Result Value Ref Range   Cholesterol 173 0 - 200 mg/dL   Triglycerides 174 (H) <150 mg/dL   HDL 43 >40 mg/dL   Total CHOL/HDL Ratio 4.0 RATIO   VLDL 35 0 - 40 mg/dL   LDL Cholesterol 95 0 - 99 mg/dL    Comment:        Total Cholesterol/HDL:CHD Risk Coronary Heart Disease Risk Table                     Men   Women  1/2 Average Risk   3.4   3.3  Average Risk       5.0   4.4  2 X Average Risk   9.6   7.1  3 X Average Risk  23.4   11.0        Use the calculated Patient Ratio above and the CHD Risk Table to  determine the patient's CHD Risk.        ATP III CLASSIFICATION (LDL):  <100     mg/dL   Optimal  100-129  mg/dL   Near or Above                    Optimal  130-159  mg/dL   Borderline  160-189  mg/dL   High  >190     mg/dL   Very High Performed at [facility]   Ethanol     Status: None   Collection Time: 04/05/15  6:35 AM  Result Value Ref Range    Alcohol, Ethyl (B) <5 <5 mg/dL    Comment:        LOWEST DETECTABLE LIMIT FOR SERUM ALCOHOL IS 5 mg/dL FOR MEDICAL PURPOSES ONLY Performed at [facility]   TSH     Status: None   Collection Time: 04/05/15  6:35 AM  Result Value Ref Range   TSH 0.393 0.350 - 4.500 uIU/mL    Comment: Performed at [facility]  Comprehensive metabolic panel     Status: Abnormal   Collection Time: 04/05/15  6:35 AM  Result Value Ref Range   Sodium 142 135 - 145 mmol/L   Potassium 4.0 3.5 - 5.1 mmol/L   Chloride 106 101 - 111 mmol/L   CO2 27 22 - 32 mmol/L   Glucose, Bld 102 (H) 65 - 99 mg/dL   BUN 20 6 - 20 mg/dL   Creatinine, Ser 0.76 0.61 - 1.24 mg/dL   Calcium 9.4 8.9 - 10.3 mg/dL   Total Protein 7.1 6.5 - 8.1 g/dL   Albumin 4.6 3.5 - 5.0 g/dL   AST 15 15 - 41 U/L   ALT 13 (L) 17 - 63 U/L   Alkaline Phosphatase 51 38 - 126 U/L   Total Bilirubin 0.9 0.3 - 1.2 mg/dL   GFR calc non Af Amer >60 >60 mL/min   GFR calc Af Amer >60 >60 mL/min    Comment: (NOTE) The eGFR has been calculated using the CKD EPI equation. This calculation has not been validated in all clinical situations. eGFR's persistently <60 mL/min signify possible Chronic Kidney Disease.    Anion gap 9 5 - 15    Comment: Performed at [facility]    Blood Alcohol level:  Lab Results  Component Value Date   [facility] <5 04/05/2015    Physical Findings: AIMS: Facial and Oral Movements Muscles of Facial Expression: None, normal Lips and Perioral Area: None, normal Jaw: None, normal Tongue: None, normal,Extremity Movements Upper (arms, wrists, hands, fingers): None, normal Lower (legs, knees, ankles, toes): None, normal, Trunk Movements Neck, shoulders, hips: None, normal, Overall Severity Severity of abnormal movements (highest score from questions above): None, normal Incapacitation due to abnormal movements: None, normal Patient's awareness of abnormal movements (rate  only patient's report): No Awareness, Dental Status Current problems with teeth and/or dentures?: No Does patient usually wear dentures?: No  CIWA:    COWS:     Musculoskeletal: Strength & Muscle Tone: within normal limits Gait & Station: normal Patient leans: N/A  Psychiatric Specialty Exam: Review of Systems  Psychiatric/Behavioral: The patient is nervous/anxious.   All other systems reviewed and are negative.   Blood pressure 117/75, pulse 68, temperature 97.7 F (36.5 C), temperature source Oral, resp. rate 16, height _0  (1.626 m), weight 56.7 kg (125 lb), SpO2 100 %.Body mass index is 21.45 kg/(m^2).  General  Appearance: Casual  Eye Contact::  Fair  Speech:  Clear and Coherent  Volume:  Normal  Mood:  Anxious  Affect:  Appropriate  Thought Process:  Goal Directed  Orientation:  Full (Time, Place, and Person)  Thought Content:  Rumination  Suicidal Thoughts:  No  Homicidal Thoughts:  No  Memory:  Immediate;   Fair Recent;   Fair Remote;   Fair  Judgement:  Impaired  Insight:  Shallow  Psychomotor Activity:  Normal  Concentration:  Poor  Recall:  [employer] of Knowledge:Fair  Language: Fair  Akathisia:  No  Handed:  Right  AIMS (if indicated):     Assets:  Desire for Improvement  ADL's:  Intact  Cognition: WNL  Sleep:  Number of Hours: 6   Treatment Plan Summary::Brent [name] is a 35 y.o.AA male ,who is married , employed, [place] in [place] , has a hx of schizophrenia , who presented with sleep issues and racing thoughts.Pt today seems to be improving, will continue treatment.  Daily contact with patient to assess and evaluate symptoms and progress in treatment and Medication management  Will continue Abilify 5 mg po qhs for mood sx. Will offer Abilify Maintena 400 mg IM - first dose tomorrow ( 04/06/15) Will continue Cogentin 0.5 mg po qhs for EPS. Will continue Trazodone 100 mg po qhs for sleep. Will make available PRN medications as per agitation  protocol. Will continue to monitor vitals ,medication compliance and treatment side effects while patient is here.  Will monitor for medical issues as well as call consult as needed.  Reviewed labs cbc - wnl, cmp - wnl, tsh - wnl, uds- negative, bal<5, ekg for qtc wnl , lipid panel - wnl ,pending pl, hba1c. CSW will continue  working on disposition.  Patient to participate in therapeutic milieu .    Luisenrique Conran, MD 04/05/2015, 2:19 PM

## 2015-04-05 NOTE — [facility] Group Notes (Signed)
[facility] LCSW Group Therapy  04/05/2015 1:36 PM  Type of Therapy: Group Therapy  Participation Level: Active  Participation Quality: Attentive  Affect: Flat  Cognitive: Oriented  Insight: Limited  Engagement in Therapy: Engaged  Modes of Intervention: Discussion and Socialization  Summary of Progress/Problems: Brent [name] from [employer] was here to tell his story of recovery and play his guitar.  Pt was pleasant and alert for the duration of group. Pt seemed interested in the topic being discussed and was appropriate.   Brent [name] 04/05/2015 1:36 PM  .

## 2015-04-05 NOTE — Progress Notes (Signed)
DAR NOTE: Patient is very calm and pleasant.  Mood and affect is bright.  Denies pain, auditory and visual hallucinations.  Rates depression at 4, hopelessness at 8, and anxiety at 0.  Maintained on routine safety checks.  Medications given as prescribed.  Support and encouragement offered as needed.  Attended group and participated.  States goal for today is "improve my sleeping."  Patient observed socializing with peers in the dayroom.  Offered no complaint.

## 2015-04-06 DIAGNOSIS — F201 Disorganized schizophrenia: Secondary | ICD-10-CM | POA: Insufficient documentation

## 2015-04-06 LAB — HEMOGLOBIN A1C
Hgb A1c MFr Bld: 4.9 % (ref 4.8–5.6)
MEAN PLASMA GLUCOSE: 94 mg/dL

## 2015-04-06 LAB — PROLACTIN: PROLACTIN: 15.3 ng/mL — AB (ref 4.0–15.2)

## 2015-04-06 MED ORDER — ARIPIPRAZOLE 5 MG PO TABS: 5.0000 mg | ORAL_TABLET | Freq: Every day | ORAL | Status: DC

## 2015-04-06 MED ORDER — BENZTROPINE MESYLATE 0.5 MG PO TABS: 0.5000 mg | ORAL_TABLET | Freq: Every day | ORAL | Status: DC

## 2015-04-06 MED ORDER — ARIPIPRAZOLE ER 400 MG IM SUSR: 400.0000 mg | INTRAMUSCULAR | Status: DC

## 2015-04-06 MED ORDER — TRAZODONE HCL 50 MG PO TABS: 50.0000 mg | ORAL_TABLET | Freq: Every evening | ORAL | Status: DC | PRN

## 2015-04-06 MED ORDER — ARIPIPRAZOLE 5 MG PO TABS
5.0000 mg | ORAL_TABLET | Freq: Every day | ORAL | Status: DC
  Filled 2015-04-06 (×2): qty 1

## 2015-04-06 NOTE — Discharge Summary (Signed)
Physician Discharge Summary Note  Patient:  Brent [name] is an 35 y.o., male MRN:  [id number] DOB:  Sep 19, 1980 Patient phone:  [phone] (home)  Patient address:   [address] [place] [address],  Total Time spent with patient: 30 minutes  Date of Admission:  04/03/2015 Date of Discharge: 04/06/2015  Reason for Admission:    Principal Problem: Schizophrenia [facility]) Discharge Diagnoses: Patient Active Problem List   Diagnosis Date Noted  . Schizophrenia ([facility]) [F20.9] 04/03/2015    Priority: High  . 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 No pertinent past medical history. History reviewed. No pertinent past surgical history. Family History:  Family History  Problem Relation Age of Onset  . Mental illness Neg Hx    Family Psychiatric  History:  See above noted Social History:  History  Alcohol Use No     History  Drug Use Not on file    Social History   Social History  . Marital Status: Single    Spouse Name: N/A  . Number of Children: N/A  . Years of Education: N/A   Social History Main Topics  . Smoking status: Never Smoker   . Smokeless tobacco: None  . Alcohol Use: No  . Drug Use: None  . Sexual Activity: Not Asked   Other Topics Concern  . None   Social History Narrative    Hospital Course:  Brent [name], 35 y.o. Male came in reporting that he had not slept or had anything to eat in 3 days. His wife was concerned and took him to the hospital. He states that he is orginally from [place] and came [place] the [place].  He reports seeing a psychiatrist but not since 5 months ago. He has also been non compliant on his meds.  He history is significant for Schizophrenia.    Brent [name] was admitted for Schizophrenia [facility]) and crisis management.  He was treated with Abilify 5 mg for mood symptoms, Abilify Maintena 400 mg IM - first dose tomorrow (  04/06/15), Cogentin 0.5 mg for EPS, Trazodone 100 mg for sleep and available PRN medications as per agitation protocol.  Medical problems were identified and treated as needed.  Home medications were restarted as appropriate.  Improvement was monitored by observation and Brent [name] daily report of symptom reduction.  Emotional and mental status was monitored by daily self inventory reports completed by Brent [name] and clinical staff.  Patient reported continued improvement, denied any new concerns.  Patient had been compliant on medications and denied side effects.  Support and encouragement was provided.    Patient did well during inpatient stay.  At time of discharge, patient rated both depression and anxiety levels to be manageable and minimal.  Patient was able to identify the triggers of emotional crises and de-stabilizations.  Patient identified the positive things in life that would help in dealing with feelings of loss, depression and unhealthy or abusive tendencies.         Brent [name] was evaluated by the treatment team for stability and plans for continued recovery upon discharge.  He was offered further treatment options upon discharge including Residential, Intensive Outpatient and Outpatient treatment.  He will follow up with agencies listed below for medication management and counseling.  Encouraged patient to maintain satisfactory support network and home environment.  Advised to adhere to medication compliance and  outpatient treatment follow up.      Brent [name] motivation was an integral factor for scheduling further treatment.  Employment, transportation, bed availability, health status, family support, and any pending legal issues were also considered during his hospital stay.  Upon completion of this admission the patient was both mentally and medically stable for discharge denying suicidal/homicidal ideation, auditory/visual/tactile hallucinations, delusional thoughts and  paranoia.       Physical Findings: AIMS: Facial and Oral Movements Muscles of Facial Expression: None, normal Lips and Perioral Area: None, normal Jaw: None, normal Tongue: None, normal,Extremity Movements Upper (arms, wrists, hands, fingers): None, normal Lower (legs, knees, ankles, toes): None, normal, Trunk Movements Neck, shoulders, hips: None, normal, Overall Severity Severity of abnormal movements (highest score from questions above): None, normal Incapacitation due to abnormal movements: None, normal Patient's awareness of abnormal movements (rate only patient's report): No Awareness, Dental Status Current problems with teeth and/or dentures?: No Does patient usually wear dentures?: No  CIWA:    COWS:     Musculoskeletal: Strength & Muscle Tone: within normal limits Gait & Station: normal Patient leans: N/A  Psychiatric Specialty Exam:  SEE MD SRA Review of Systems  Psychiatric/Behavioral: Negative for depression, suicidal ideas and hallucinations.  All other systems reviewed and are negative.   Blood pressure 115/73, pulse 90, temperature 97.7 F (36.5 C), temperature source Oral, resp. rate 20, height 5\' 4"  (1.626 m), weight 56.7 kg (125 lb), SpO2 100 %.Body mass index is 21.45 kg/(m^2).  Have you used any form of tobacco in the last 30 days? (Cigarettes, Smokeless Tobacco, Cigars, and/or Pipes): No  Has this patient used any form of tobacco in the last 30 days? (Cigarettes, Smokeless Tobacco, Cigars, and/or Pipes) Yes, NA  Blood Alcohol level:  Lab Results  Component Value Date   ETH <5 04/05/2015    Metabolic Disorder Labs:  Lab Results  Component Value Date   HGBA1C 4.9 04/05/2015   MPG 94 04/05/2015   Lab Results  Component Value Date   PROLACTIN 15.3* 04/05/2015   Lab Results  Component Value Date   CHOL 173 04/05/2015   TRIG 174* 04/05/2015   HDL 43 04/05/2015   CHOLHDL 4.0 04/05/2015   VLDL 35 04/05/2015   LDLCALC 95 04/05/2015    See  Psychiatric Specialty Exam and Suicide Risk Assessment completed by Attending Physician prior to discharge.  Discharge destination:  Home  Is patient on multiple antipsychotic therapies at discharge:  No   Has Patient had three or more failed trials of antipsychotic monotherapy by history:  No  Recommended Plan for Multiple Antipsychotic Therapies: NA     Medication List    STOP taking these medications        acetaminophen 500 MG tablet  Commonly known as:  TYLENOL     SLEEP AID 25 MG tablet  Generic drug:  doxylamine (Sleep)      TAKE these medications      Indication   ARIPiprazole 5 MG tablet  Commonly known as:  ABILIFY  Take 1 tablet (5 mg total) by mouth at bedtime.   Indication:  mood stabilization     ARIPiprazole 400 MG Susr  Inject 400 mg into the muscle every 30 (thirty) days. Last dose 04/06/15, next dose due 04/17/15.   Indication:  Schizophrenia     benztropine 0.5 MG tablet  Commonly known as:  COGENTIN  Take 1 tablet (0.5 mg total) by mouth at bedtime.   Indication:  Extrapyramidal Reaction caused by  Medications     traZODone 50 MG tablet  Commonly known as:  DESYREL  Take 1 tablet (50 mg total) by mouth at bedtime as needed for sleep.   Indication:  Trouble Sleeping           Follow-up Information    Follow up with [facility] On 05/05/2015.   Why:  Friday at 2:00 fro a medication appointment with [facility] information:   [address]  [place]  [phone]      Follow-up recommendations:  Activity:  as tol Diet:  as tol  Comments:  1.  Take all your medications as prescribed.   2.  Report any adverse side effects to outpatient provider. 3.  Patient instructed to not use alcohol or illegal drugs while on prescription medicines. 4.  In the event of worsening symptoms, instructed patient to call 911, the crisis hotline or go to nearest emergency room for evaluation of symptoms.  Signed: Lindwood Qua, NP [facility] 04/06/2015, 1:22 PM

## 2015-04-06 NOTE — Tx Team (Signed)
Interdisciplinary Treatment Plan Update (Adult)  Date:  04/06/2015   Time Reviewed:  9:41 AM   Progress in Treatment: Attending groups: Yes. Participating in groups:  Yes. Taking medication as prescribed:  Yes. Tolerating medication:  Yes. Family/Significant other contact made:  Yes Patient understands diagnosis:  Yes  As evidenced by seeking help with "insomnia and racing thoughts" Discussing patient identified problems/goals with staff:  Yes, see initial care plan. Medical problems stabilized or resolved:  Yes. Denies suicidal/homicidal ideation: Yes. Issues/concerns per patient self-inventory:  No. Other:  New problem(s) identified:  Discharge Plan or Barriers: see below  Reason for Continuation of Hospitalization:   Comments:  Brent [name] is an 35 y.o. male that reports seeing things that no one else can see. Patient reports that he has not slept or had anything to eat in 3 days. Patient reports that if he closes his eyes and then opens them that everything will be different. Patient reports that things will look different and furniture will be moved. Patient reports that if it is day when he closes his eye then it will be night when he opens his eyes.  Abilify trial  Estimated length of stay:  D/C today  New goal(s):  Review of initial/current patient goals per problem list:   Review of initial/current patient goals per problem list:  1. Goal(s): Patient will participate in aftercare plan   Met: Yes   Target date: 3-5 days post admission date   As evidenced by: Patient will participate within aftercare plan AEB aftercare provider and housing plan at discharge being identified. 04/05/15:  Return home, follow up outpt    5. Goal(s): Patient will demonstrate decreased signs of psychosis  * Met: Yes  * Target date: 3-5 days post admission date  * As evidenced by: Patient will demonstrate decreased frequency of AVH or return to baseline function 04/05/15:   Pt was not taking meds prior to admission.  C/O insomnia and racing thoughts 04/06/15:  No signs nor symptoms of psychosis today.  Pt is sleeping well.          Attendees: Patient:  04/06/2015 9:41 AM   Family:   04/06/2015 9:41 AM   Physician:  Ursula Alert, MD 04/06/2015 9:41 AM   Nursing:   Phillis Haggis, RN 04/06/2015 9:41 AM   CSW:    Roque Lias, LCSW   04/06/2015 9:41 AM   Other:  04/06/2015 9:41 AM   Other:   04/06/2015 9:41 AM   Other:  Lars Pinks, Nurse CM 04/06/2015 9:41 AM   Other:   04/06/2015 9:41 AM   Other:  Norberto Sorenson, [place]  04/06/2015 9:41 AM   Other:  04/06/2015 9:41 AM   Other:  04/06/2015 9:41 AM   Other:  04/06/2015 9:41 AM   Other:  04/06/2015 9:41 AM   Other:  04/06/2015 9:41 AM   Other:   04/06/2015 9:41 AM    Scribe for Treatment Team:   Trish Mage, 04/06/2015 9:41 AM

## 2015-04-06 NOTE — Progress Notes (Signed)
Patient discharged home with prescription. Patient was stable and appreciative at that time. All papers and prescriptions were given and valuables returned. Verbal understanding expressed. Denies SI/HI and A/VH. Patient given opportunity to express concerns and ask questions.  

## 2015-04-06 NOTE — B[facility] Suicide Risk Assessment (Signed)
[facility]   Principal Problem: Schizophrenia [facility](HCC) Discharge Diagnoses:  Patient Active Problem List   Diagnosis Date Noted  . Schizophrenia (HCC) [F20.9] 04/03/2015    Total Time spent with patient: 30 minutes  Musculoskeletal: Strength & Muscle Tone: within normal limits Gait & Station: normal Patient leans: N/A  Psychiatric Specialty Exam: Review of Systems  Psychiatric/Behavioral: Negative for depression, suicidal ideas, hallucinations and substance abuse. The patient is not nervous/anxious.   All other systems reviewed and are negative.   Blood pressure 115/73, pulse 90, temperature 97.7 F (36.5 C), temperature source Oral, resp. rate 20, height 5\' 4"  (1.626 m), weight 56.7 kg (125 lb), SpO2 100 %.Body mass index is 21.45 kg/(m^2).  General Appearance: Casual  Eye Contact::  Fair  Speech:  Normal Rate409  Volume:  Normal  Mood:  Euthymic  Affect:  Appropriate  Thought Process:  Coherent  Orientation:  Full (Time, Place, and Person)  Thought Content:  WDL  Suicidal Thoughts:  No  Homicidal Thoughts:  No  Memory:  Immediate;   Fair Recent;   Fair Remote;   Fair  Judgement:  Fair  Insight:  Fair  Psychomotor Activity:  Normal  Concentration:  Fair  Recall:  FiservFair  Fund of Knowledge:Fair  Language: Fair  Akathisia:  No  Handed:  Right  AIMS (if indicated):   0  Assets:  Desire for Improvement  Sleep:  Number of Hours: 6.25  Cognition: WNL  ADL's:  Intact   Mental Status Per Nursing Assessment::   On Admission:     Demographic Factors:  Male  Loss Factors: NA  Historical Factors: Impulsivity  Risk Reduction Factors:   Positive social support  Continued Clinical Symptoms:  Previous Psychiatric Diagnoses and Treatments  Cognitive Features That Contribute To Risk:  None    Suicide Risk:  Minimal: No identifiable suicidal ideation.  Patients presenting with no risk factors but with morbid ruminations; may be classified  as minimal risk based on the severity of the depressive symptoms    Plan Of Care/Follow-up recommendations:  Activity:  no restrictions Diet:  regular Tests:  prolactin level needs to be followed on an out patient basis Other:  Abilify Maintena IM 400 mg first dose today 04/06/15.  Jentzen Minasyan, MD 04/06/2015, 9:20 AM

## 2015-04-06 NOTE — Progress Notes (Signed)
  [facility] :  Will you be returning to the same living situation after discharge:  Yes,  home At discharge, do you have transportation home?: Yes,  wife Do you have the ability to pay for your medications: Yes,  insurance  Release of information consent forms completed and in the chart;  Patient's signature needed at discharge.  Patient to Follow up at: Follow-up Information    Follow up with [facility] On 05/05/2015.   Why:  Friday at 2:00 fro a medication appointment with Leone Payorrystal Montague   Contact information:   [address]  [place]  [phone]      Next level of care provider has [facility] to [facility] Link:no  Safety Planning and Suicide Prevention discussed: Yes,  yes  Have you used any form of tobacco in the last 30 days? (Cigarettes, Smokeless Tobacco, Cigars, and/or Pipes): No  Has patient been referred to the Quitline?: N/A patient is not a smoker  Patient has been referred for addiction treatment: N/A  Ida Rogueorth, Lafonda Patron B 04/06/2015, 9:42 AM

## 2015-04-06 NOTE — Progress Notes (Addendum)
D: Pt presents appropriate in affect and pleasant in mood. Pt verbalizes having a clearer thought process. Pt denied any SI/HI/AVH. Pt did not appear to be responding to any internal stimuli. Pt observed interacting with his peers appropriately within the milieu. Pt is looking forward to upcoming discharge.  A: Writer administered scheduled medications to pt, per MD orders. Continued support and availability as needed was extended to this pt. Staff continues to monitor pt with q7415min checks.  R: No adverse drug reactions noted. Pt receptive to treatment. Pt remains safe at this time.

## 2015-04-06 NOTE — BHH Group Notes (Signed)
BHH Group Notes:  (Nursing/MHT/Case Management/Adjunct)  Date:  04/06/2015  Time:  2:53 PM  Type of Therapy:  Nurse Education  Participation Level:  Active  Participation Quality:  Appropriate and Attentive  Affect:  Appropriate  Cognitive:  Alert and Appropriate  Insight:  Appropriate, Good and Improving  Engagement in Group:  Engaged and Improving  Modes of Intervention:  Activity, Discussion and Education    Summary of Progress/Problems: Topic was on leisure and lifestyle changes. Discussed the importance of choosing a healthy leisure activities. Group encouraged to surround themselves with positive and healthy group/support system when changing to a healthy lifestyle. Patient was receptive and contributed. Patient states, "I need to work on improving my sleep."     Mickie Baillizabeth O Iwenekha 04/06/2015, 2:53 PM

## 2016-02-01 ENCOUNTER — Encounter (HOSPITAL_COMMUNITY): Payer: Self-pay | Admitting: Emergency Medicine

## 2016-02-01 ENCOUNTER — Inpatient Hospital Stay (HOSPITAL_COMMUNITY)
Admission: AD | Admit: 2016-02-01 | Discharge: 2016-02-03 | DRG: 885 | Disposition: A | Discharge status: Home / Self Care | Payer: Managed Care, Other (non HMO) | Attending: Psychiatry | Admitting: Psychiatry

## 2016-02-01 DIAGNOSIS — Z888 Allergy status to other drugs, medicaments and biological substances status: Secondary | ICD-10-CM | POA: Diagnosis not present

## 2016-02-01 DIAGNOSIS — Z79899 Other long term (current) drug therapy: Secondary | ICD-10-CM | POA: Diagnosis not present

## 2016-02-01 DIAGNOSIS — F200 Paranoid schizophrenia: Secondary | ICD-10-CM | POA: Diagnosis not present

## 2016-02-01 DIAGNOSIS — F209 Schizophrenia, unspecified: Principal | ICD-10-CM | POA: Diagnosis present

## 2016-02-01 LAB — URINALYSIS, ROUTINE W REFLEX MICROSCOPIC
Bacteria, UA: NONE SEEN
Bilirubin Urine: NEGATIVE
GLUCOSE, UA: NEGATIVE mg/dL
Ketones, ur: NEGATIVE mg/dL
LEUKOCYTES UA: NEGATIVE
NITRITE: NEGATIVE
PH: 5 (ref 5.0–8.0)
Protein, ur: NEGATIVE mg/dL
SPECIFIC GRAVITY, URINE: 1.015 (ref 1.005–1.030)
Squamous Epithelial / LPF: NONE SEEN

## 2016-02-01 MED ORDER — CYANOCOBALAMIN 250 MCG PO TABS
250.0000 ug | ORAL_TABLET | Freq: Every day | ORAL | Status: DC
  Administered 2016-02-01 – 2016-02-03 (×3): 250 ug via ORAL
  Filled 2016-02-01 (×5): qty 1

## 2016-02-01 MED ORDER — ARIPIPRAZOLE 5 MG PO TABS
5.0000 mg | ORAL_TABLET | Freq: Every day | ORAL | Status: DC
  Administered 2016-02-01 – 2016-02-02 (×2): 5 mg via ORAL
  Filled 2016-02-01 (×4): qty 1
  Filled 2016-02-01: qty 7

## 2016-02-01 MED ORDER — BENZTROPINE MESYLATE 0.5 MG PO TABS
0.5000 mg | ORAL_TABLET | Freq: Every day | ORAL | Status: DC
  Administered 2016-02-01 – 2016-02-02 (×2): 0.5 mg via ORAL
  Filled 2016-02-01 (×4): qty 1
  Filled 2016-02-01: qty 7

## 2016-02-01 MED ORDER — MAGNESIUM HYDROXIDE 400 MG/5ML PO SUSP: 30.0000 mL | Freq: Every day | ORAL | Status: DC | PRN

## 2016-02-01 MED ORDER — ARIPIPRAZOLE ER 400 MG IM SRER
400.0000 mg | INTRAMUSCULAR | Status: DC
  Administered 2016-02-02: 400 mg via INTRAMUSCULAR
  Filled 2016-02-01: qty 400

## 2016-02-01 MED ORDER — ALUM & MAG HYDROXIDE-SIMETH 200-200-20 MG/5ML PO SUSP: 30.0000 mL | ORAL | Status: DC | PRN

## 2016-02-01 MED ORDER — ACETAMINOPHEN 325 MG PO TABS: 650.0000 mg | ORAL_TABLET | Freq: Four times a day (QID) | ORAL | Status: DC | PRN

## 2016-02-01 MED ORDER — TRAZODONE HCL 50 MG PO TABS
50.0000 mg | ORAL_TABLET | Freq: Every evening | ORAL | Status: DC | PRN
  Administered 2016-02-01 – 2016-02-02 (×2): 50 mg via ORAL
  Filled 2016-02-01: qty 1
  Filled 2016-02-01: qty 7
  Filled 2016-02-01: qty 1

## 2016-02-01 MED ORDER — HYDROXYZINE HCL 25 MG PO TABS
25.0000 mg | ORAL_TABLET | Freq: Four times a day (QID) | ORAL | Status: DC | PRN
  Filled 2016-02-01: qty 10

## 2016-02-01 NOTE — BHH Suicide Risk Assessment (Signed)
BHH INPATIENT:  Family/Significant Other Suicide Prevention Education  Suicide Prevention Education:  Education Completed; No one has been identified by the patient as the family member/significant other with whom the patient will be residing, and identified as the person(s) who will aid the patient in the event of a mental health crisis (suicidal ideations/suicide attempt).  With written consent from the patient, the family member/significant other has been provided the following suicide prevention education, prior to the and/or following the discharge of the patient.  The suicide prevention education provided includes the following:  Suicide risk factors  Suicide prevention and interventions  National Suicide Hotline telephone number  [facility] assessment telephone number  [facility] Emergency Assistance 911  [facility] and/or Residential Mobile Crisis Unit telephone number  Request made of family/significant other to:  Remove weapons (e.g., guns, rifles, knives), all items previously/currently identified as safety concern.    Remove drugs/medications (over-the-counter, prescriptions, illicit drugs), all items previously/currently identified as a safety concern.  The family member/significant other verbalizes understanding of the suicide prevention education information provided.  The family member/significant other agrees to remove the items of safety concern listed above. The patient did not endorse SI at the time of admission, nor did the patient c/o SI during the stay here.  SPE not required.  Ida RogueRodney B Danzel Marszalek 02/01/2016, 4:37 PM

## 2016-02-01 NOTE — Progress Notes (Signed)
Brent [name] is a 36 year old male being admitted voluntarily to [facility]  He reported that he had an appointment this afternoon with his OP psychiatric provider but that appointment was cancelled due to the weather.  He was due for his injection today and is worried because he hasn't slept in two nights.  "I don't do well when I don't sleep."  His wife encouraged him to stay and have help with his trouble sleeping.  He denies SI/HI or A/V hallucinations.  He did report that he was feeling very tired and not wanting to do things.  Oriented him to the unit.  Admission paperwork completed and signed.  Belongings searched and secured in locker # 44.  Skin assessment completed and no skin issues noted.  Q 15 minute checks initiated for safety.  We will monitor the progress towards his goals.

## 2016-02-01 NOTE — H&P (Signed)
Behavioral Health Medical Screening Exam  Brent [name] is an 36 y.o. male.  Total Time spent with patient: 20 minutes  Psychiatric Specialty Exam: Physical Exam  ROS  Blood pressure 134/81, pulse 62, temperature 98.6 F (37 C), temperature source Oral, resp. rate 18, SpO2 100 %.There is no height or weight on file to calculate BMI.  General Appearance: Casual  Eye Contact:  Fair  Speech:  Clear and Coherent and Normal Rate  Volume:  Normal  Mood:  Depressed  Affect:  Congruent and Depressed  Thought Process:  Coherent, Goal Directed and Linear  Orientation:  Full (Time, Place, and Person)  Thought Content:  Logical  Suicidal Thoughts:  No  Homicidal Thoughts:  No  Memory:  Immediate;   Good Recent;   Fair Remote;   Fair  Judgement:  Good  Insight:  Good  Psychomotor Activity:  Normal  Concentration: Concentration: Good and Attention Span: Fair  Recall:  Good  Fund of Knowledge:Good  Language: Good  Akathisia:  No  Handed:  Right  AIMS (if indicated):     Assets:  Communication Skills Desire for Improvement Financial Resources/Insurance Housing Intimacy Physical Health Resilience Social Support Transportation  Sleep:       Musculoskeletal: Strength & Muscle Tone: within normal limits Gait & Station: normal Patient leans: N/A  Blood pressure 134/81, pulse 62, temperature 98.6 F (37 C), temperature source Oral, resp. rate 18, SpO2 100 %.  Recommendations:  Based on my evaluation the patient does not appear to have an emergency medical condition.  Pt meets criteria for inpatient psychiatric admission. [address]  Laveda AbbeLaurie Britton Mayzie Caughlin, NP 02/01/2016, 2:33 PM

## 2016-02-01 NOTE — BHH Group Notes (Signed)
Pt did not attend karaoke this evening.  

## 2016-02-01 NOTE — BH Assessment (Signed)
Tele Assessment Note  Pt presents to [name] for assessment accompanied by his wife of 5 yrs, Brent [name], and family friend, [name]. Per chart review, pt was admitted to [name] inpatient unit March 2017 for psychosis and schizophrenia. Pt is pleasant and oriented x 4. He is soft spoken. He reports he hasn't eaten or slept in two days. He describes his recent mood as "not happy". He endorses moderate anxiety. Pt denies [name] currently. When Probation officer asks pt this questions, pt's wife looks at Probation officer and nods her indicating "yes" he has had [name]. Wife refuses to elaborate. He reports he moved to the [place] from [name] in 1993. He says he witnessed war and he often thinks about it. Pt sts he tries to avoid watching movies that have shooting and killing in it. He says violent movies remind him of [name]. Pt denies SI currently or at any time in the past. Pt denies any history of suicide attempts, or of self-mutilation. Pt denies homicidal thoughts or physical aggression. Pt denies having access to firearms. Pt denies having any legal problems at this time. Pt denies any current or past substance abuse problems. Pt does not appear to be intoxicated or in withdrawal at this time. He says he gets sick easily in cold weather, but he denies having been sick recently. Pt sts that he needs to come into [name] so he can get on his medications.  Friend [name] reports pt is one month and one week late for his Abilify injection. She said he missed his appt last week and was supposed to go yesterday. She says [name] office was closed yesterday d/t snow. [place] [name] reports prior to pt's admission to [name] in March, pt had 3-4 days of insomnia and not eating. She says they wanted pt to be admitted before his psychosis gets worse.    Brent [name] is an 36 y.o. male.   Diagnosis: Schizophrenia  Past Medical History: No past medical history on file.  No past surgical history on file.  Family History:  Family History   Problem Relation Age of Onset  . Mental illness Neg Hx     Social History:  reports that he has never smoked. He does not have any smokeless tobacco history on file. He reports that he does not drink alcohol or use drugs.  Additional Social History:  Alcohol / Drug Use Pain Medications: pt denies abuse - see pta meds list Prescriptions: pt denies abuse - see pta meds lst Over the Counter: pt denies abuse - see pta meds list Longest period of sobriety (when/how long): n/a  CIWA: CIWA-Ar BP: 134/81 Pulse Rate: 62 COWS:    PATIENT STRENGTHS: (choose at least two) Ability for insight Average or above average intelligence Capable of independent living Communication skills General fund of knowledge Supportive family/friends  Allergies: No Known Allergies  Home Medications:  Medications Prior to Admission  Medication Sig Dispense Refill  . ARIPiprazole (ABILIFY) 5 MG tablet Take 1 tablet (5 mg total) by mouth at bedtime. 21 tablet 0  . ARIPiprazole 400 MG SUSR Inject 400 mg into the muscle every 30 (thirty) days. Last dose 04/06/15, next dose due 04/17/15. 1 each 1  . benztropine (COGENTIN) 0.5 MG tablet Take 1 tablet (0.5 mg total) by mouth at bedtime. 30 tablet 0  . traZODone (DESYREL) 50 MG tablet Take 1 tablet (50 mg total) by mouth at bedtime as needed for sleep. 30 tablet 0    OB/GYN Status:  No LMP for male patient.  General Assessment Data Location of Assessment: [name] TTS Assessment: In system Is this a Tele or Face-to-Face Assessment?: Face-to-Face Is this an Initial Assessment or a Re-assessment for this encounter?: Initial Assessment Marital status: Married [name] name: none Is patient pregnant?: No Pregnancy Status: No Living Arrangements: Spouse/significant other (married 5 yrs) Can pt return to current living arrangement?: Yes Admission Status: Voluntary Is patient capable of signing voluntary admission?: Yes Referral Source:  Self/Family/Friend Insurance type: Materials engineer Exam ([place]) Medical Exam completed: Yes  Crisis Care Plan Living Arrangements: Spouse/significant other (married 5 yrs) Name of Psychiatrist: Akintayo Name of Therapist: none  Education Status Is patient currently in school?: No Highest grade of school patient has completed: 12  Risk to self with the past 6 months Suicidal Ideation: No Has patient been a risk to self within the past 6 months prior to admission? : No Suicidal Intent: No Has patient had any suicidal intent within the past 6 months prior to admission? : No Is patient at risk for suicide?: No Suicidal Plan?: No Has patient had any suicidal plan within the past 6 months prior to admission? : No Access to Means: No What has been your use of drugs/alcohol within the last 12 months?: none Previous Attempts/Gestures: No How many times?: 0 Other Self Harm Risks: none Triggers for Past Attempts:  (n/a) Intentional Self Injurious Behavior: None Family Suicide History: No Recent stressful life event(s):  (insomnia) Persecutory voices/beliefs?: No Depression: No Depression Symptoms: Feeling angry/irritable, Fatigue, Insomnia Substance abuse history and/or treatment for substance abuse?: No Suicide prevention information given to non-admitted patients: Not applicable  Risk to Others within the past 6 months Homicidal Ideation: No Does patient have any lifetime risk of violence toward others beyond the six months prior to admission? : No Thoughts of Harm to Others: No Current Homicidal Intent: No Current Homicidal Plan: No Access to Homicidal Means: No Identified Victim: none History of harm to others?: No Assessment of Violence: None Noted Violent Behavior Description: pt denies hx violence Does patient have access to weapons?: No Criminal Charges Pending?: No Does patient have a court date: No Is patient on probation?:  No  Psychosis Hallucinations: None noted (visual in past) Delusions: None noted  Mental Status Report Appearance/Hygiene: Unremarkable (in appropriate street clothes) Eye Contact: Good Motor Activity: Freedom of movement Speech: Logical/coherent, Soft Level of Consciousness: Alert, Quiet/awake Mood: Irritable, Anxious, Sad ("not happy") Affect: Appropriate to circumstance Anxiety Level: Moderate Thought Processes: Relevant, Coherent Judgement: Unimpaired Orientation: Person, Place, Time, Situation Obsessive Compulsive Thoughts/Behaviors: None  Cognitive Functioning Concentration: Decreased Memory: Remote Impaired, Recent Impaired IQ: Average Insight: Good Impulse Control: Good Appetite: Poor Weight Loss:  (hasn't eaten in two days) Sleep: Decreased Total Hours of Sleep: 0 (hasn't slept in two days) Vegetative Symptoms: None  ADLScreening [name] Services) Patient's cognitive ability adequate to safely complete daily activities?: Yes Patient able to express need for assistance with ADLs?: Yes Independently performs ADLs?: Yes (appropriate for developmental age)  Prior Inpatient Therapy Prior Inpatient Therapy: Yes Prior Therapy Dates: March 2017 & 2016 Prior Therapy Facilty/Provider(s): [facility] Reason for Treatment: schizophrenia, psychosis  Prior Outpatient Therapy Prior Outpatient Therapy: Yes Prior Therapy Dates: currently Prior Therapy Facilty/Provider(s): Dr Darleene Cleaver at [facility] Reason for Treatment: med management Does patient have an ACCT team?: No Does patient have Intensive In-House Services?  : No Does patient have [facility] services? : No Does patient  have P4CC services?: No  ADL Screening (condition at time of admission) Patient's cognitive ability adequate to safely complete daily activities?: Yes Is the patient deaf or have difficulty hearing?: No Does the patient have difficulty seeing, even when wearing  glasses/contacts?: No Does the patient have difficulty concentrating, remembering, or making decisions?: Yes Patient able to express need for assistance with ADLs?: Yes Does the patient have difficulty dressing or bathing?: No Independently performs ADLs?: Yes (appropriate for developmental age) Does the patient have difficulty walking or climbing stairs?: No Weakness of Legs: None Weakness of Arms/Hands: None  Home Assistive Devices/Equipment Home Assistive Devices/Equipment: None    Abuse/Neglect Assessment (Assessment to be complete while patient is alone) Physical Abuse: Denies Verbal Abuse: Denies Sexual Abuse: Denies Exploitation of patient/patient's resources: Denies Self-Neglect: Denies     Regulatory affairs officer (For Healthcare) Does Patient Have a Medical Advance Directive?: No Would patient like information on creating a medical advance directive?: No - Patient declined    Additional Information 1:1 In Past 12 Months?: No CIRT Risk: No Elopement Risk: No Does patient have medical clearance?: No     Disposition:  Disposition Initial Assessment Completed for this Encounter: Yes Disposition of Patient: Inpatient treatment program Type of inpatient treatment program:  (laurie parks NP accepts [facility] to [facility])  [place], [place] 02/01/2016 3:25 PM

## 2016-02-01 NOTE — Progress Notes (Signed)
Patient ID: [place] [name], male   DOB: 10/27/1980, 36 y.o.   MRN: [id number] D: client in bed, quiet, reports "no voices, no harmful thoughts" but appears to be preoccupied, sad affect. A: Writer provided emotional support, encouraged client to report any concerns. Medications reviewed, administered as ordered. Staff will monitor q5915min for safety. R:Client is safe on the unit, did not attend karaoke.

## 2016-02-01 NOTE — Tx Team (Addendum)
Initial Treatment Plan 02/01/2016 6:32 PM Barre A Heatherly OZH:[id number]    PATIENT STRESSORS: Medication change or noncompliance Occupational concerns Traumatic event   PATIENT STRENGTHS: MetallurgistCommunication skills Financial means General fund of knowledge Motivation for treatment/growth Physical Health   PATIENT IDENTIFIED PROBLEMS: Trouble sleeping/psychosis-flashbacks from childhood  Anxiety   Depression  "Refresh/relax my mind"  "Sleep well"             DISCHARGE CRITERIA:  Improved stabilization in mood, thinking, and/or behavior Verbal commitment to aftercare and medication compliance  PRELIMINARY DISCHARGE PLAN: Outpatient therapy Medication management  PATIENT/FAMILY INVOLVEMENT: This treatment plan has been presented to and reviewed with the patient, Dermot A Lyall.  The patient and family have been given the opportunity to ask questions and make suggestions.  Levin BaconHeather V Cheryln Balcom, RN 02/01/2016, 6:32 PM

## 2016-02-02 ENCOUNTER — Encounter (HOSPITAL_COMMUNITY): Payer: Self-pay | Admitting: Psychiatry

## 2016-02-02 DIAGNOSIS — Z79899 Other long term (current) drug therapy: Secondary | ICD-10-CM

## 2016-02-02 DIAGNOSIS — Z888 Allergy status to other drugs, medicaments and biological substances status: Secondary | ICD-10-CM

## 2016-02-02 DIAGNOSIS — F200 Paranoid schizophrenia: Secondary | ICD-10-CM

## 2016-02-02 DIAGNOSIS — F209 Schizophrenia, unspecified: Secondary | ICD-10-CM | POA: Diagnosis present

## 2016-02-02 LAB — COMPREHENSIVE METABOLIC PANEL
ALT: 14 U/L — AB (ref 17–63)
ANION GAP: 6 (ref 5–15)
AST: 14 U/L — ABNORMAL LOW (ref 15–41)
Albumin: 4.4 g/dL (ref 3.5–5.0)
Alkaline Phosphatase: 34 U/L — ABNORMAL LOW (ref 38–126)
BUN: 15 mg/dL (ref 6–20)
CHLORIDE: 106 mmol/L (ref 101–111)
CO2: 29 mmol/L (ref 22–32)
CREATININE: 0.94 mg/dL (ref 0.61–1.24)
Calcium: 9.4 mg/dL (ref 8.9–10.3)
Glucose, Bld: 103 mg/dL — ABNORMAL HIGH (ref 65–99)
Potassium: 4.5 mmol/L (ref 3.5–5.1)
Sodium: 141 mmol/L (ref 135–145)
Total Bilirubin: 1.5 mg/dL — ABNORMAL HIGH (ref 0.3–1.2)
Total Protein: 6.8 g/dL (ref 6.5–8.1)

## 2016-02-02 LAB — HEPATIC FUNCTION PANEL
ALT: 12 U/L — AB (ref 17–63)
AST: 16 U/L (ref 15–41)
Albumin: 4.4 g/dL (ref 3.5–5.0)
Alkaline Phosphatase: 35 U/L — ABNORMAL LOW (ref 38–126)
BILIRUBIN DIRECT: 0.2 mg/dL (ref 0.1–0.5)
BILIRUBIN INDIRECT: 1.4 mg/dL — AB (ref 0.3–0.9)
BILIRUBIN TOTAL: 1.6 mg/dL — AB (ref 0.3–1.2)
Total Protein: 6.9 g/dL (ref 6.5–8.1)

## 2016-02-02 LAB — CBC
HEMATOCRIT: 40 % (ref 39.0–52.0)
Hemoglobin: 13.8 g/dL (ref 13.0–17.0)
MCH: 29.3 pg (ref 26.0–34.0)
MCHC: 34.5 g/dL (ref 30.0–36.0)
MCV: 84.9 fL (ref 78.0–100.0)
PLATELETS: 207 10*3/uL (ref 150–400)
RBC: 4.71 MIL/uL (ref 4.22–5.81)
RDW: 12.1 % (ref 11.5–15.5)
WBC: 5.2 10*3/uL (ref 4.0–10.5)

## 2016-02-02 LAB — LIPID PANEL
CHOLESTEROL: 161 mg/dL (ref 0–200)
HDL: 41 mg/dL (ref >40)
LDL Cholesterol: 104 mg/dL — ABNORMAL HIGH (ref 0–99)
TRIGLYCERIDES: 81 mg/dL (ref <150)
Total CHOL/HDL Ratio: 3.9 RATIO
VLDL: 16 mg/dL (ref 0–40)

## 2016-02-02 LAB — TSH: TSH: 0.531 u[IU]/mL (ref 0.350–4.500)

## 2016-02-02 MED ORDER — OLANZAPINE 10 MG IM SOLR: 5.0000 mg | Freq: Three times a day (TID) | INTRAMUSCULAR | Status: DC | PRN

## 2016-02-02 MED ORDER — ENSURE ENLIVE PO LIQD
237.0000 mL | Freq: Two times a day (BID) | ORAL | Status: DC
  Administered 2016-02-02 – 2016-02-03 (×3): 237 mL via ORAL

## 2016-02-02 MED ORDER — OLANZAPINE 5 MG PO TABS: 5.0000 mg | ORAL_TABLET | Freq: Three times a day (TID) | ORAL | Status: DC | PRN

## 2016-02-02 NOTE — Progress Notes (Signed)
Adult Psychoeducational Group Note  Date:  02/02/2016 Time:  8:56 PM  Group Topic/Focus:  Wrap-Up Group:   The focus of this group is to help patients review their daily goal of treatment and discuss progress on daily workbooks.  Participation Level:  Active  Participation Quality:  Appropriate  Affect:  Appropriate  Cognitive:  Appropriate  Insight: Appropriate  Engagement in Group:  Engaged  Modes of Intervention:  Discussion  Additional Comments:  The patient expressed that he attended groups.The patient also said that he rates today a 9.  Octavio Mannshigpen, Trinaty Bundrick Lee 02/02/2016, 8:56 PM

## 2016-02-02 NOTE — Progress Notes (Signed)
Patient ID: [name], male   DOB: 07/13/1980, 36 y.o.   MRN: [id number] D: client visible on the unit, seen in dayroom watching the game, no interaction with peer note, minimal interaction with staff. Client reports of this day "I needed to sleep, I'm back to normal" A: Writer provided emotional support encouraged client to report any concerns. Medications reviewed, administered as ordered. Staff will monitor q2115min for safety. R: Client is safe on the unit, attended group.

## 2016-02-02 NOTE — BHH Suicide Risk Assessment (Signed)
[facility]   Nursing information obtained from:  Patient Demographic factors:  Male Current Mental Status:  NA Loss Factors:  NA Historical Factors:  NA Risk Reduction Factors:  Employed, Living with another person, especially a relative, Positive therapeutic relationship, Sense of responsibility to family  Total Time spent with patient: 20 minutes Principal Problem: Schizophrenia (HCC) Diagnosis:   Patient Active Problem List   Diagnosis Date Noted  . Schizophrenia (HCC) [F20.9] 02/02/2016   Subjective Data: Please see H&P.   Continued Clinical Symptoms:  Alcohol Use Disorder Identification Test Final Score (AUDIT): 0 The "Alcohol Use Disorders Identification Test", Guidelines for Use in Primary Care, Second Edition.  World Science writerHealth Organization [facility](WHO). Score between 0-7:  no or low risk or alcohol related problems. Score between 8-15:  moderate risk of alcohol related problems. Score between 16-19:  high risk of alcohol related problems. Score 20 or above:  warrants further diagnostic evaluation for alcohol dependence and treatment.   CLINICAL FACTORS:   Schizophrenia:   Less than 36 years old   Musculoskeletal: Strength & Muscle Tone: within normal limits Gait & Station: normal Patient leans: N/A  Psychiatric Specialty Exam: Physical Exam  Review of Systems  Psychiatric/Behavioral: The patient is nervous/anxious and has insomnia.   All other systems reviewed and are negative.   Blood pressure 102/78, pulse 82, temperature 98.3 F (36.8 C), temperature source Oral, resp. rate 16, height 5\' 6"  (1.676 m), weight 64 kg (141 lb), SpO2 100 %.Body mass index is 22.76 kg/m.                          Please see H&P.                                 COGNITIVE FEATURES THAT CONTRIBUTE TO RISK:  Closed-mindedness, Polarized thinking and Thought constriction (tunnel vision)    SUICIDE RISK:   Mild:  Suicidal ideation of  limited frequency, intensity, duration, and specificity.  There are no identifiable plans, no associated intent, mild dysphoria and related symptoms, good self-control (both objective and subjective assessment), few other risk factors, and identifiable protective factors, including available and accessible social support.  PLAN OF CARE: Please see H&P.   I certify that inpatient services furnished can reasonably be expected to improve the patient's condition.   Ireland Virrueta, MD 02/02/2016, 3:10 PM

## 2016-02-02 NOTE — Tx Team (Signed)
Interdisciplinary Treatment and Diagnostic Plan Update  02/02/2016 Time of Session: 2:07 PM  Brent [name] MRN: [id number]  Principal Diagnosis: Schizophrenia [facility])  Secondary Diagnoses: Principal Problem:   Schizophrenia ([place]) Active Problems:   Paranoid type schizophrenia, chronic state with acute exacerbation ([place])   Current Medications:  Current Facility-Administered Medications  Medication Dose Route Frequency Provider Last Rate Last Dose  . acetaminophen (TYLENOL) tablet 650 mg  650 mg Oral Q6H PRN Ethelene Hal, NP      . alum & mag hydroxide-simeth (MAALOX/MYLANTA) 200-200-20 MG/5ML suspension 30 mL  30 mL Oral Q4H PRN Ethelene Hal, NP      . ARIPiprazole (ABILIFY) tablet 5 mg  5 mg Oral QHS Ethelene Hal, NP   5 mg at 02/01/16 2137  . ARIPiprazole ER SRER 400 mg  400 mg Intramuscular Q30 days Ursula Alert, MD   400 mg at 02/02/16 1017  . benztropine (COGENTIN) tablet 0.5 mg  0.5 mg Oral QHS Ethelene Hal, NP   0.5 mg at 02/01/16 2137  . feeding supplement (ENSURE ENLIVE) (ENSURE ENLIVE) liquid 237 mL  237 mL Oral BID BM Ethelene Hal, NP   237 mL at 02/02/16 1058  . hydrOXYzine (ATARAX/VISTARIL) tablet 25 mg  25 mg Oral Q6H PRN Ethelene Hal, NP      . magnesium hydroxide (MILK OF MAGNESIA) suspension 30 mL  30 mL Oral Daily PRN Ethelene Hal, NP      . traZODone (DESYREL) tablet 50 mg  50 mg Oral QHS PRN Ethelene Hal, NP   50 mg at 02/01/16 2137  . vitamin B-12 (CYANOCOBALAMIN) tablet 250 mcg  250 mcg Oral Daily Ethelene Hal, NP   250 mcg at 02/02/16 2440    PTA Medications: Prescriptions Prior to Admission  Medication Sig Dispense Refill Last Dose  . ARIPiprazole 400 MG SUSR Inject 400 mg into the muscle every 30 (thirty) days. Last dose 04/06/15, next dose due 04/17/15. (Patient taking differently: Inject 400 mg into the muscle every 30 (thirty) days. ) 1 each 1 02/02/2016 at Unknown time  . traZODone  (DESYREL) 50 MG tablet Take 1 tablet (50 mg total) by mouth at bedtime as needed for sleep. 30 tablet 0 01/31/2016  . ARIPiprazole (ABILIFY) 5 MG tablet Take 1 tablet (5 mg total) by mouth at bedtime. (Patient not taking: Reported on 02/02/2016) 21 tablet 0 Not Taking at Unknown time  . benztropine (COGENTIN) 0.5 MG tablet Take 1 tablet (0.5 mg total) by mouth at bedtime. (Patient not taking: Reported on 02/02/2016) 30 tablet 0 Not Taking at Unknown time    Treatment Modalities: Medication Management, Group therapy, Case management,  1 to 1 session with clinician, Psychoeducation, Recreational therapy.   Physician Treatment Plan for Primary Diagnosis: Schizophrenia ([place]) Long Term Goal(s): Improvement in symptoms so as ready for discharge  Short Term Goals:    Medication Management: Evaluate patient's response, side effects, and tolerance of medication regimen.  Therapeutic Interventions: 1 to 1 sessions, Unit Group sessions and Medication administration.  Evaluation of Outcomes: Adequate for Discharge  Physician Treatment Plan for Secondary Diagnosis: Principal Problem:   Schizophrenia ([place]) Active Problems:   Paranoid type schizophrenia, chronic state with acute exacerbation ([place])   Long Term Goal(s): Improvement in symptoms so as ready for discharge  Short Term Goals:    Medication Management: Evaluate patient's response, side effects, and tolerance of medication regimen.  Therapeutic Interventions: 1 to 1 sessions, Unit Group sessions and Medication  administration.  Evaluation of Outcomes: Adequate for Discharge   RN Treatment Plan for Primary Diagnosis: Schizophrenia ([place]) Long Term Goal(s): Knowledge of disease and therapeutic regimen to maintain health will improve  Short Term Goals: Ability to identify and develop effective coping behaviors will improve and Compliance with prescribed medications will improve  Medication Management: RN will administer medications as  ordered by provider, will assess and evaluate patient's response and provide education to patient for prescribed medication. RN will report any adverse and/or side effects to prescribing provider.  Therapeutic Interventions: 1 on 1 counseling sessions, Psychoeducation, Medication administration, Evaluate responses to treatment, Monitor vital signs and CBGs as ordered, Perform/monitor CIWA, COWS, AIMS and Fall Risk screenings as ordered, Perform wound care treatments as ordered.  Evaluation of Outcomes: Adequate for Discharge   LCSW Treatment Plan for Primary Diagnosis: Schizophrenia [facility]) Long Term Goal(s): Safe transition to appropriate next level of care at discharge, Engage patient in therapeutic group addressing interpersonal concerns.  Short Term Goals: Engage patient in aftercare planning with referrals and resources  Therapeutic Interventions: Assess for all discharge needs, 1 to 1 time with Social worker, Explore available resources and support systems, Assess for adequacy in community support network, Educate family and significant other(s) on suicide prevention, Complete Psychosocial Assessment, Interpersonal group therapy.  Evaluation of Outcomes: Met  Return home, follow up [place] in Treatment: Attending groups: Yes Participating in groups: Yes Taking medication as prescribed: Yes Toleration medication: Yes, no side effects reported at this time Family/Significant other contact made: No Patient understands diagnosis: Yes AEB asking for help with getting his injection. Discussing patient identified problems/goals with staff: Yes Medical problems stabilized or resolved: Yes Denies suicidal/homicidal ideation: Yes Issues/concerns per patient self-inventory: None Other: N/A  New problem(s) identified: None identified at this time.   New Short Term/Long Term Goal(s): None identified at this time.   Discharge Plan or Barriers:   Reason for  Continuation of Hospitalization: Poor sleep Paranoia Medication stabilization   Estimated Length of Stay: 3-5 days  Attendees: Patient: 02/02/2016  2:07 PM  Physician: Ursula Alert, MD 02/02/2016  2:07 PM  Nursing: Hoy Register, RN 02/02/2016  2:07 PM  RN Care Manager: Lars Pinks, RN 02/02/2016  2:07 PM  Social Worker: Ripley Fraise 02/02/2016  2:07 PM  Recreational Therapist: Laretta Bolster  02/02/2016  2:07 PM  Other: Norberto Sorenson 02/02/2016  2:07 PM  Other:  02/02/2016  2:07 PM    Scribe for Treatment Team:  Roque Lias LCSW 02/02/2016 2:07 PM
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
## 2016-02-02 NOTE — Plan of Care (Signed)
Problem: Safety: Goal: Periods of time without injury will increase Outcome: Progressing Client is safe on the unit AEB [id number] safety checks, client denies SHI, takes medication without hesitation.

## 2016-02-02 NOTE — Progress Notes (Signed)
DAR NOTE: Patient presents with anxious affect and  mood.  Reports good night sleep.  Denies pain, auditory and visual hallucinations.  Described energy level as normal and concentration as good. Rates depression at 1, hopelessness at 0, and anxiety at 0.  Maintained on routine safety checks.  Medications given as prescribed.  Support and encouragement offered as needed.  Attended group and participated.  States goal for today is "to sleep good."  Patient observed socializing with peers in the dayroom.  Abilify injection given.  No adverse reaction noted. Offered no complaint.

## 2016-02-02 NOTE — Progress Notes (Signed)
  [facility] Discharge [facility] :  Will you be returning to the same living situation after discharge:  Yes,  home At discharge, do you have transportation home?: Yes,  wife Do you have the ability to pay for your medications: Yes,  insurance  Release of information consent forms completed and in the chart;  Patient's signature needed at discharge.  Patient to Follow up at: Follow-up Information    [facility] Follow up on 03/01/2016.   Why:  Friday at 5:15 with Crystal M for your next injection and your hospital follow up appointment. Bring your hospital d/c paperwork Contact information: [address] [place] [id number] [phone]           Next level of care provider has [facility] Link:no  Safety Planning and Suicide Prevention discussed: Yes,  yes  Have you used any form of tobacco in the last 30 days? (Cigarettes, Smokeless Tobacco, Cigars, and/or Pipes): No  Has patient been referred to the Quitline?: N/A patient is not a smoker  Patient has been referred for addiction treatment: N/A  Brent [name] 02/02/2016, 3:34 PM
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
## 2016-02-02 NOTE — H&P (Signed)
Psychiatric Admission Assessment Adult  Patient Identification: Brent [name] MRN:  [id number] Date of Evaluation:  02/02/2016 Chief Complaint:Patient states " I was not sleeping and I was anxious , I missed my injection.'  Principal Diagnosis: Schizophrenia ([place]) Diagnosis:   Patient Active Problem List   Diagnosis Date Noted  . Schizophrenia ([place]) [F20.9] 02/02/2016   History of Present Illness: Pt is a 40 y old AAM,who is married , employed , who has a hx of schizophrenia , who lives in [place], [place] ,  with his wife, who presented to [facility] for worsening mood sx.  Per initial notes in EHR : ' Pt presents to [place] for assessment accompanied by his wife of 5 yrs, [name], and family friend, [name]. He reports he hasn't eaten or slept in two days. He describes his recent mood as "not happy".  Pt sts that he needs to come into [facility] so he can get on his medications. Friend [name] reports pt is one month and one week late for his Abilify injection. She said he missed his appt last week and was supposed to go yesterday. She says [name] office was closed yesterday d/t snow. Bank reports prior to pt's admission to [facility] in March, pt had 3-4 days of insomnia and not eating. She says they wanted pt to be admitted before his psychosis gets worse.  "  Patient seen and chart reviewed today .Discussed patient with treatment team. Pt today seen as anxious , but states he feels better than when he came in. Pt reports that his mood lability is also improving.Pt reports worsening mood sx, anxiety and insomnia since the past 2 days .  Pt reports that he is more calm and alert right now, now that he is back on his medications. Pt reports that missing his dose of medications always makes him feel this way. Pt denies any other concerns. Pt advised to attend groups , participate and is he has a good night sleep tonight and his mood continues to improve , he can be discharged  tomorrow.   Associated Signs/Symptoms: Depression Symptoms:  insomnia, difficulty concentrating, (Hypo) Manic Symptoms:  Distractibility, Impulsivity, Irritable Mood, Labiality of Mood, Anxiety Symptoms:  Excessive Worry, Psychotic Symptoms:  Paranoia, PTSD Symptoms: Negative Total Time spent with patient: 45 minutes  Past Psychiatric History: Patient has a hx of schizophrenia , has had another admission at [facility] in march 2017, IP admission three years ago in [place]. Pt follows up with Dr.[name] office . Pt denied suicide attempts.   Is the patient at risk to self? Yes.    Has the patient been a risk to self in the past 6 months? No.  Has the patient been a risk to self within the distant past? Yes.    Is the patient a risk to others? No.  Has the patient been a risk to others in the past 6 months? No.  Has the patient been a risk to others within the distant past? No.   Prior Inpatient Therapy: Prior Inpatient Therapy: Yes Prior Therapy Dates: March 2017 & 2016 Prior Therapy Facilty/Provider(s): [facility] Reason for Treatment: schizophrenia, psychosis Prior Outpatient Therapy: Prior Outpatient Therapy: Yes Prior Therapy Dates: currently Prior Therapy Facilty/Provider(s): Dr Darleene Cleaver at [facility] Reason for Treatment: med management Does patient have an ACCT team?: No Does patient have Intensive In-House Services?  : No Does patient have [facility] services? : No Does patient have P4CC services?: No  Alcohol Screening: 1. How often do you have a drink containing alcohol?: Never 9. Have you or someone else been injured as a result of your drinking?: No 10. Has a relative or friend or a doctor or another health worker been concerned about your drinking or suggested you cut down?: No Alcohol Use Disorder Identification Test Final Score (AUDIT): 0 Substance Abuse History in the last 12 months:  No. Consequences of Substance  Abuse: Negative Previous Psychotropic Medications: Yes  Psychological Evaluations: No  Past Medical History: Pt denies hx of HTN, DM, thyroid disease, seizure do. Family History: denies hx of htn, dm, thyroid disease, heart disease. Family History  Problem Relation Age of Onset  . Mental illness Neg Hx    Family Psychiatric  History: pt denies hx of mental illness Tobacco Screening: Have you used any form of tobacco in the last 30 days? (Cigarettes, Smokeless Tobacco, Cigars, and/or Pipes): No Social History: is employed , married , lives in [place], [place]. History  Alcohol Use No     History  Drug Use No    Additional Social History: Marital status: Married    Pain Medications: pt denies abuse - see pta meds list Prescriptions: pt denies abuse - see pta meds lst Over the Counter: pt denies abuse - see pta meds list History of alcohol / drug use?: No history of alcohol / drug abuse Longest period of sobriety (when/how long): n/a                    Allergies:   Allergies  Allergen Reactions  . Haloperidol Other (See Comments)    Patient experienced acute dystonia after receiving 10 mg dose of haldol. Listed in outside allergies from [place]   Lab Results:  Results for orders placed or performed during the hospital encounter of 02/01/16 (from the past 48 hour(s))  Urinalysis, Routine w reflex microscopic     Status: Abnormal   Collection Time: 02/01/16  5:00 PM  Result Value Ref Range   Color, Urine YELLOW YELLOW   APPearance CLEAR CLEAR   Specific Gravity, Urine 1.015 1.005 - 1.030   pH 5.0 5.0 - 8.0   Glucose, UA NEGATIVE NEGATIVE mg/dL   Hgb urine dipstick SMALL (A) NEGATIVE   Bilirubin Urine NEGATIVE NEGATIVE   Ketones, ur NEGATIVE NEGATIVE mg/dL   Protein, ur NEGATIVE NEGATIVE mg/dL   Nitrite NEGATIVE NEGATIVE   Leukocytes, UA NEGATIVE NEGATIVE   RBC / HPF 0-5 0 - 5 RBC/hpf   WBC, UA 0-5 0 - 5 WBC/hpf   Bacteria, UA NONE SEEN NONE SEEN   Squamous  Epithelial / LPF NONE SEEN NONE SEEN   Mucous PRESENT     Comment: Performed at [facility], [place] [address] [place], [place] [address]  CBC     Status: None   Collection Time: 02/02/16  6:14 AM  Result Value Ref Range   WBC 5.2 4.0 - 10.5 K/uL   RBC 4.71 4.22 - 5.81 MIL/uL   Hemoglobin 13.8 13.0 - 17.0 g/dL   HCT 40.0 39.0 - 52.0 %   MCV 84.9 78.0 - 100.0 fL   MCH 29.3 26.0 - 34.0 pg   MCHC 34.5 30.0 - 36.0 g/dL   RDW 12.1 11.5 - 15.5 %   Platelets 207 150 - 400 K/uL    Comment: Performed at [facility], [place] [address] [place], [place] [address]  Comprehensive metabolic panel     Status: Abnormal  Collection Time: 02/02/16  6:14 AM  Result Value Ref Range   Sodium 141 135 - 145 mmol/L   Potassium 4.5 3.5 - 5.1 mmol/L   Chloride 106 101 - 111 mmol/L   CO2 29 22 - 32 mmol/L   Glucose, Bld 103 (H) 65 - 99 mg/dL   BUN 15 6 - 20 mg/dL   Creatinine, Ser 0.94 0.61 - 1.24 mg/dL   Calcium 9.4 8.9 - 10.3 mg/dL   Total Protein 6.8 6.5 - 8.1 g/dL   Albumin 4.4 3.5 - 5.0 g/dL   AST 14 (L) 15 - 41 U/L   ALT 14 (L) 17 - 63 U/L   Alkaline Phosphatase 34 (L) 38 - 126 U/L   Total Bilirubin 1.5 (H) 0.3 - 1.2 mg/dL   GFR calc non Af Amer >60 >60 mL/min   GFR calc Af Amer >60 >60 mL/min    Comment: (NOTE) The eGFR has been calculated using the CKD EPI equation. This calculation has not been validated in all clinical situations. eGFR's persistently <60 mL/min signify possible Chronic Kidney Disease.    Anion gap 6 5 - 15    Comment: Performed at [facility], [place] [address], [place], [place] [address]  Lipid panel     Status: Abnormal   Collection Time: 02/02/16  6:14 AM  Result Value Ref Range   Cholesterol 161 0 - 200 mg/dL   Triglycerides 81 <150 mg/dL   HDL 41 >40 mg/dL   Total CHOL/HDL Ratio 3.9 RATIO   VLDL 16 0 - 40 mg/dL   LDL Cholesterol 104 (H) 0 - 99 mg/dL    Comment:        Total Cholesterol/HDL:CHD  Risk Coronary Heart Disease Risk Table                     Men   Women  1/2 Average Risk   3.4   3.3  Average Risk       5.0   4.4  2 X Average Risk   9.6   7.1  3 X Average Risk  23.4   11.0        Use the calculated Patient Ratio above and the CHD Risk Table to determine the patient's CHD Risk.        ATP III CLASSIFICATION (LDL):  <100     mg/dL   Optimal  100-129  mg/dL   Near or Above                    Optimal  130-159  mg/dL   Borderline  160-189  mg/dL   High  >190     mg/dL   Very High Performed at [place] [address], [place], [place] [address]   Hepatic function panel     Status: Abnormal   Collection Time: 02/02/16  6:14 AM  Result Value Ref Range   Total Protein 6.9 6.5 - 8.1 g/dL   Albumin 4.4 3.5 - 5.0 g/dL   AST 16 15 - 41 U/L   ALT 12 (L) 17 - 63 U/L   Alkaline Phosphatase 35 (L) 38 - 126 U/L   Total Bilirubin 1.6 (H) 0.3 - 1.2 mg/dL   Bilirubin, Direct 0.2 0.1 - 0.5 mg/dL   Indirect Bilirubin 1.4 (H) 0.3 - 0.9 mg/dL    Comment: Performed at [facility], [place] [address] [place], [place] [address]  TSH     Status:  None   Collection Time: 02/02/16  6:14 AM  Result Value Ref Range   TSH 0.531 0.350 - 4.500 uIU/mL    Comment: Performed by a 3rd Generation assay with a functional sensitivity of <=0.01 uIU/mL. Performed at [facility], [place] [address] [place], [place] [address]     Blood Alcohol level:  Lab Results  Component Value Date   ETH <5 87/56/4332    Metabolic Disorder Labs:  Lab Results  Component Value Date   HGBA1C 4.9 04/05/2015   MPG 94 04/05/2015   Lab Results  Component Value Date   PROLACTIN 15.3 (H) 04/05/2015   Lab Results  Component Value Date   CHOL 161 02/02/2016   TRIG 81 02/02/2016   HDL 41 02/02/2016   CHOLHDL 3.9 02/02/2016   VLDL 16 02/02/2016   LDLCALC 104 (H) 02/02/2016   [place] 95 04/05/2015    Current Medications: Current Facility-Administered  Medications  Medication Dose Route Frequency Provider Last Rate Last Dose  . acetaminophen (TYLENOL) tablet 650 mg  650 mg Oral Q6H PRN Ethelene Hal, NP      . alum & mag hydroxide-simeth (MAALOX/MYLANTA) 200-200-20 MG/5ML suspension 30 mL  30 mL Oral Q4H PRN Ethelene Hal, NP      . ARIPiprazole (ABILIFY) tablet 5 mg  5 mg Oral QHS Ethelene Hal, NP   5 mg at 02/01/16 2137  . ARIPiprazole ER SRER 400 mg  400 mg Intramuscular Q30 days Ursula Alert, MD   400 mg at 02/02/16 1017  . benztropine (COGENTIN) tablet 0.5 mg  0.5 mg Oral QHS Ethelene Hal, NP   0.5 mg at 02/01/16 2137  . feeding supplement (ENSURE ENLIVE) (ENSURE ENLIVE) liquid 237 mL  237 mL Oral BID BM Ethelene Hal, NP   237 mL at 02/02/16 1529  . hydrOXYzine (ATARAX/VISTARIL) tablet 25 mg  25 mg Oral Q6H PRN Ethelene Hal, NP      . magnesium hydroxide (MILK OF MAGNESIA) suspension 30 mL  30 mL Oral Daily PRN Ethelene Hal, NP      . OLANZapine (ZYPREXA) tablet 5 mg  5 mg Oral TID PRN Ursula Alert, MD       Or  . OLANZapine (ZYPREXA) injection 5 mg  5 mg Intramuscular TID PRN Ursula Alert, MD      . traZODone (DESYREL) tablet 50 mg  50 mg Oral QHS PRN Ethelene Hal, NP   50 mg at 02/01/16 2137  . vitamin B-12 (CYANOCOBALAMIN) tablet 250 mcg  250 mcg Oral Daily Ethelene Hal, NP   250 mcg at 02/02/16 9518   PTA Medications: Prescriptions Prior to Admission  Medication Sig Dispense Refill Last Dose  . ARIPiprazole 400 MG SUSR Inject 400 mg into the muscle every 30 (thirty) days. Last dose 04/06/15, next dose due 04/17/15. (Patient taking differently: Inject 400 mg into the muscle every 30 (thirty) days. ) 1 each 1 02/02/2016 at Unknown time  . traZODone (DESYREL) 50 MG tablet Take 1 tablet (50 mg total) by mouth at bedtime as needed for sleep. 30 tablet 0 01/31/2016  . ARIPiprazole (ABILIFY) 5 MG tablet Take 1 tablet (5 mg total) by mouth at bedtime. (Patient not taking:  Reported on 02/02/2016) 21 tablet 0 Not Taking at Unknown time  . benztropine (COGENTIN) 0.5 MG tablet Take 1 tablet (0.5 mg total) by mouth at bedtime. (Patient not taking: Reported on 02/02/2016) 30 tablet 0 Not Taking at Unknown time  Musculoskeletal: Strength & Muscle Tone: within normal limits Gait & Station: normal Patient leans: N/A  Psychiatric Specialty Exam: Physical Exam  Review of Systems  Psychiatric/Behavioral: The patient is nervous/anxious and has insomnia.   All other systems reviewed and are negative.   Blood pressure 102/78, pulse 82, temperature 98.3 F (36.8 C), temperature source Oral, resp. rate 16, height '5\' 6"'  (1.676 m), weight 64 kg (141 lb), SpO2 100 %.Body mass index is 22.76 kg/m.  General Appearance: Fairly Groomed  Eye Contact:  Fair  Speech:  Normal Rate  Volume:  Decreased  Mood:  Anxious, Depressed and Dysphoric  Affect:  Congruent  Thought Process:  Goal Directed and Descriptions of Associations: Circumstantial  Orientation:  Full (Time, Place, and Person)  Thought Content:  Rumination   Suicidal Thoughts:  No lack of sleep , mood lability potential danger to self   Homicidal Thoughts:  No  Memory:  Immediate;   Fair Recent;   Fair Remote;   Fair  Judgement:  Impaired  Insight:  Lacking  Psychomotor Activity:  Restlessness  Concentration:  Concentration: Poor and Attention Span: Poor  Recall:  [employer] of Knowledge:  Fair  Language:  Fair  Akathisia:  No  Handed:  Right  AIMS (if indicated):     Assets:  Desire for Improvement  ADL's:  Intact  Cognition:  WNL  Sleep:  Number of Hours: 6.25    Treatment Plan Summary:Patient restarted on medications , will continue to observe on the unit.  Daily contact with patient to assess and evaluate symptoms and progress in treatment and Medication management Patient will benefit from inpatient treatment and stabilization.  Estimated length of stay is 5-7 days.  Reviewed past medical  records,treatment plan.  Will restart Abilify 400 mg IM - q30 days - for schizophrenia - dose given 02/02/16 . Continue Abilify 5 mg po qhs - since he missed his scheduled dose of IM recently. Restart Trazodone 50 mg po qhs prn for sleep. Will continue to monitor vitals ,medication compliance and treatment side effects while patient is here.  Will monitor for medical issues as well as call consult as needed.  Reviewed labs bilirubin elevated - will get acute hepatitis panel, tsh - wnl, pending hba1c, pl. EKG reviewed - qtc - wnl. CSW will start working on disposition.  Patient to participate in therapeutic milieu .      Observation Level/Precautions:  15 minute checks    Psychotherapy:  Individual and group therapy     Consultations:  CSW  Discharge Concerns:  Stability and safety       Physician Treatment Plan for Primary Diagnosis: Schizophrenia ([place]) Long Term Goal(s): Improvement in symptoms so as ready for discharge  Short Term Goals: Ability to verbalize feelings will improve and Compliance with prescribed medications will improve  Physician Treatment Plan for Secondary Diagnosis: Principal Problem:   Schizophrenia ([place])  Long Term Goal(s): Improvement in symptoms so as ready for discharge  Short Term Goals: Ability to verbalize feelings will improve and Compliance with prescribed medications will improve  I certify that inpatient services furnished can reasonably be expected to improve the patient's condition.    Ursula Alert, MD 1/19/20183:31 PM

## 2016-02-03 LAB — HEMOGLOBIN A1C
Hgb A1c MFr Bld: 4.6 % — ABNORMAL LOW (ref 4.8–5.6)
MEAN PLASMA GLUCOSE: 85 mg/dL

## 2016-02-03 LAB — HEPATITIS PANEL, ACUTE
HCV Ab: <0.1 s/co ratio (ref 0.0–0.9)
HEP B S AG: NEGATIVE
Hep A IgM: NEGATIVE
Hep B C IgM: NEGATIVE

## 2016-02-03 LAB — PROLACTIN: PROLACTIN: 17 ng/mL — AB (ref 4.0–15.2)

## 2016-02-03 MED ORDER — TRAZODONE HCL 50 MG PO TABS: 50.0000 mg | ORAL_TABLET | Freq: Every evening | ORAL | 0 refills | Status: DC | PRN

## 2016-02-03 MED ORDER — HYDROXYZINE HCL 25 MG PO TABS: 25.0000 mg | ORAL_TABLET | Freq: Four times a day (QID) | ORAL | 0 refills | Status: DC | PRN

## 2016-02-03 MED ORDER — ARIPIPRAZOLE ER 400 MG IM SRER: 400.0000 mg | INTRAMUSCULAR | 0 refills | Status: DC

## 2016-02-03 MED ORDER — ARIPIPRAZOLE 5 MG PO TABS: 5.0000 mg | ORAL_TABLET | Freq: Every day | ORAL | 0 refills | Status: DC

## 2016-02-03 MED ORDER — BENZTROPINE MESYLATE 0.5 MG PO TABS: 0.5000 mg | ORAL_TABLET | Freq: Every day | ORAL | 0 refills | Status: DC

## 2016-02-03 NOTE — Discharge Summary (Signed)
Physician Discharge Summary Note  Patient:  Brent [name] is an 36 y.o., male MRN:  [id number] DOB:  [birth year] Patient phone:  [phone] (home)  Patient address:   [address] [place] [address],  Total Time spent with patient: 30 minutes  Date of Admission:  02/01/2016 Date of Discharge: 02/03/2016  Reason for Admission:  Worsening symptoms  Principal Problem: Schizophrenia [facility]) Discharge Diagnoses: Patient Active Problem List   Diagnosis Date Noted  . Schizophrenia ([facility]) [F20.9] 02/02/2016    Past Psychiatric History: see HPI  Past Medical History: History reviewed. No pertinent past medical history. History reviewed. No pertinent surgical history. Family History:  Family History  Problem Relation Age of Onset  . Mental illness Neg Hx    Family Psychiatric  History: see HPI Social History:  History  Alcohol Use No     History  Drug Use No    Social History   Social History  . Marital status: Single    Spouse name: N/A  . Number of children: N/A  . Years of education: N/A   Social History Main Topics  . Smoking status: Never Smoker  . Smokeless tobacco: Never Used  . Alcohol use No  . Drug use: No  . Sexual activity: Not Asked   Other Topics Concern  . None   Social History Narrative  . None    Hospital Course:  Laquentin Sorto, 25 y old AAM, with a hx of schizophrenia presented to [facility] for worsening mood sx.  Kato A Maniaci was admitted for Schizophrenia [facility]) and crisis management.  Patient was treated with medications with their indications listed below in detail under Medication List.  Medical problems were identified and treated as needed.  Home medications were restarted as appropriate.  Improvement was monitored by observation and Eissa A Anand daily report of symptom reduction.  Emotional and mental status was monitored by daily self inventory reports completed by Cordero A Whorley and clinical staff.  Patient reported continued  improvement, denied any new concerns.  Patient had been compliant on medications and denied side effects.  Support and encouragement was provided.         Alioune A Meissner was evaluated by the treatment team for stability and plans for continued recovery upon discharge.  Patient was offered further treatment options upon discharge including Residential, Intensive Outpatient and Outpatient treatment. Patient will follow up with agency listed below for medication management and counseling.  Encouraged patient to maintain satisfactory support network and home environment.  Advised to adhere to medication compliance and outpatient treatment follow up.  Prescriptions provided.       Derak A Vanmaanen motivation was an integral factor for scheduling further treatment.  Employment, transportation, bed availability, health status, family support, and any pending legal issues were also considered during patient's hospital stay.  Upon completion of this admission the patient was both mentally and medically stable for discharge denying suicidal/homicidal ideation, auditory/visual/tactile hallucinations, delusional thoughts and paranoia.      Physical Findings: AIMS: Facial and Oral Movements Muscles of Facial Expression: None, normal Lips and Perioral Area: None, normal Jaw: None, normal Tongue: None, normal,Extremity Movements Upper (arms, wrists, hands, fingers): None, normal Lower (legs, knees, ankles, toes): None, normal, Trunk Movements Neck, shoulders, hips: None, normal, Overall Severity Severity of abnormal movements (highest score from questions above): None, normal Incapacitation due to abnormal movements: None, normal Patient's awareness of abnormal movements (rate only patient's report): No Awareness, Dental Status Current problems with teeth and/or  dentures?: No Does patient usually wear dentures?: No  CIWA:    COWS:     Musculoskeletal: Strength & Muscle Tone: within normal limits Gait &  Station: normal Patient leans: N/A  Psychiatric Specialty Exam:  See MD SRA Physical Exam  Nursing note and vitals reviewed.   ROS  Blood pressure 105/71, pulse 81, temperature 97.6 F (36.4 C), temperature source Oral, resp. rate 18, height 5\' 6"  (1.676 m), weight 64 kg (141 lb), SpO2 100 %.Body mass index is 22.76 kg/m.    Have you used any form of tobacco in the last 30 days? (Cigarettes, Smokeless Tobacco, Cigars, and/or Pipes): No  Has this patient used any form of tobacco in the last 30 days? (Cigarettes, Smokeless Tobacco, Cigars, and/or Pipes) Yes, No  Blood Alcohol level:  Lab Results  Component Value Date   ETH <5 04/05/2015    Metabolic Disorder Labs:  Lab Results  Component Value Date   HGBA1C 4.6 (L) 02/02/2016   MPG 85 02/02/2016   MPG 94 04/05/2015   Lab Results  Component Value Date   PROLACTIN 17.0 (H) 02/02/2016   PROLACTIN 15.3 (H) 04/05/2015   Lab Results  Component Value Date   CHOL 161 02/02/2016   TRIG 81 02/02/2016   HDL 41 02/02/2016   CHOLHDL 3.9 02/02/2016   VLDL 16 02/02/2016   LDLCALC 104 (H) 02/02/2016   LDLCALC 95 04/05/2015    See Psychiatric Specialty Exam and Suicide Risk Assessment completed by Attending Physician prior to discharge.  Discharge destination:  Home  Is patient on multiple antipsychotic therapies at discharge:  No   Has Patient had three or more failed trials of antipsychotic monotherapy by history:  No  Recommended Plan for Multiple Antipsychotic Therapies: NA   Allergies as of 02/03/2016      Reactions   Haloperidol Other (See Comments)   Patient experienced acute dystonia after receiving 10 mg dose of haldol. Listed in outside allergies from [facility]      Medication List    STOP taking these medications   ARIPiprazole ER 400 MG Susr Replaced by:  ARIPiprazole ER 400 MG Srer You also have another medication with the same name that you need to continue taking as instructed.     TAKE these  medications     Indication  ARIPiprazole 5 MG tablet Commonly known as:  ABILIFY Take 1 tablet (5 mg total) by mouth at bedtime. What changed:  Another medication with the same name was added. Make sure you understand how and when to take each.  Another medication with the same name was removed. Continue taking this medication, and follow the directions you see here.  Indication:  Major Depressive Disorder   ARIPiprazole ER 400 MG Srer Inject 400 mg into the muscle every 30 (thirty) days. Next dose due 2/18 Start taking on:  03/03/2016 What changed:  You were already taking a medication with the same name, and this prescription was added. Make sure you understand how and when to take each. Replaces:  ARIPiprazole ER 400 MG Susr  Indication:  Mixed Bipolar Affective Disorder   benztropine 0.5 MG tablet Commonly known as:  COGENTIN Take 1 tablet (0.5 mg total) by mouth at bedtime.  Indication:  Extrapyramidal Reaction caused by Medications   hydrOXYzine 25 MG tablet Commonly known as:  ATARAX/VISTARIL Take 1 tablet (25 mg total) by mouth every 6 (six) hours as needed for anxiety.  Indication:  Anxiety Neurosis   traZODone 50 MG tablet Commonly  known as:  DESYREL Take 1 tablet (50 mg total) by mouth at bedtime as needed for sleep.  Indication:  Trouble Sleeping      Follow-up Information    [facility] Follow up on 03/01/2016.   Why:  Friday at 5:15 with Crystal M for your next injection and your hospital follow up appointment. Bring your hospital d/c paperwork Contact information: [address] [place] [address] [phone]           Follow-up recommendations:  Activity:  as tol Diet:  as tol  Comments:  1.  Take all your medications as prescribed.   2.  Report any adverse side effects to outpatient provider. 3.  Patient instructed to not use alcohol or illegal drugs while on prescription medicines. 4.  In the event of worsening  symptoms, instructed patient to call 911, the crisis hotline or go to nearest emergency room for evaluation of symptoms.  Signed: Lindwood QuaSheila May Sayde Lish, NP [facility] 02/03/2016, 9:21 AM

## 2016-02-03 NOTE — Progress Notes (Signed)
Patient ID: Brent [name], male   DOB: 07/02/1980, 36 y.o.   MRN: [id number]  Pt was discharged home with wife, pt reported that he was ready yo get back to work. Pt reported that his depression was a 2, his hopelessness was a 2, and anxiety was a 0. Pt reported that he was negative SI/HI, no AH/VH noted. Pt reported that his goal for today was to wait on wife to pick him up. Pt requested a letter for work, letter done by SW.

## 2016-02-03 NOTE — [facility] Group Notes (Signed)
[facility] Group Notes:  (Clinical Social Work)  02/03/2016  11:15-12:00PM  Summary of Progress/Problems:   Today's process group involved patients discussing their plans on how to stay well once they are discharge from the hospital, from taking medicine, to exercising, to seeing their doctor, and more.   The patient expressed that sports, exercise, and taking his injection are ways he will stay well.  He was very supportive of others and interactive.  Type of Therapy:  Group Therapy - Process  Participation Level:  Active  Participation Quality:  Appropriate and Attentive  Affect:  Appropriate  Cognitive:  Appropriate  Insight:  Engaged  Engagement in Therapy:  Engaged  Modes of Intervention:  Exploration, Discussion  Brent MantleMareida Grossman-Orr, LCSW 02/03/2016, 1:08 PM

## 2016-02-03 NOTE — [facility] Suicide Risk Assessment (Signed)
[facility]   Principal Problem: Schizophrenia [facility](HCC) Discharge Diagnoses:  Patient Active Problem List   Diagnosis Date Noted  . Schizophrenia (HCC) [F20.9] 02/02/2016    Total Time spent with patient: 20 minutes  Musculoskeletal: Strength & Muscle Tone: within normal limits Gait & Station: normal Patient leans: N/A  Psychiatric Specialty Exam: Review of Systems  Psychiatric/Behavioral: Positive for depression. Negative for hallucinations, substance abuse and suicidal ideas. The patient is nervous/anxious and has insomnia.   All other systems reviewed and are negative.   Blood pressure 105/71, pulse 81, temperature 97.6 F (36.4 C), temperature source Oral, resp. rate 18, height 5\' 6"  (1.676 m), weight 141 lb (64 kg), SpO2 100 %.Body mass index is 22.76 kg/m.  General Appearance: Casual  Eye Contact::  Good  Speech:  Clear and Coherent409  Volume:  Normal  Mood:  "better"  Affect:  Constricted  Thought Process:  Coherent and Goal Directed  Orientation:  Full (Time, Place, and Person)  Thought Content:  Logical Perceptions: denies AH/VH  Suicidal Thoughts:  No  Homicidal Thoughts:  No  Memory:  Immediate;   Good Recent;   Good Remote;   Good  Judgement:  Fair  Insight:  Fair  Psychomotor Activity:  Normal  Concentration:  Good  Recall:  Good  Fund of Knowledge:Good  Language: Fair  Akathisia:  No  Handed:  Right  AIMS (if indicated):     Assets:  Communication Skills Desire for Improvement  Sleep:  Number of Hours: 6.75  Cognition: WNL  ADL's:  Intact   Mental Status Per Nursing Assessment::   On Admission:  NA  Demographic Factors:  Male  Loss Factors: NA  Historical Factors: Impulsivity  Risk Reduction Factors:   Sense of responsibility to family and Employed  Continued Clinical Symptoms:  Depression:   Insomnia  Cognitive Features That Contribute To Risk:  None    Suicide Risk:  Mild:  Suicidal ideation of  limited frequency, intensity, duration, and specificity.  There are no identifiable plans, no associated intent, mild dysphoria and related symptoms, good self-control (both objective and subjective assessment), few other risk factors, and identifiable protective factors, including available and accessible social support.  Follow-up Information    [facility] Follow up on 03/01/2016.   Why:  Friday at 5:15 with Crystal M for your next injection and your hospital follow up appointment. Bring your hospital d/c paperwork Contact information: [address] [place] [id number] [phone]          Patient states that he had anxiety which was triggered by his experience in [place] (people killed) prior to this admission. When he feels anxious, he has insomnia and has gets irritable. He feels better mood and denies SI. He will be discharged to home with his wife.  He feels comfortable about his current medication regimen and agrees with outpatient follow up as above.   Plan Of Care/Follow-up recommendations:  Activity:  regular Diet:  regular Tests:  n/a Other:  n/a  Brent Hottereina Bluma Buresh, MD 02/03/2016, 1:14 PM

## 2016-08-17 ENCOUNTER — Emergency Department (HOSPITAL_COMMUNITY)
Admission: EM | Admit: 2016-08-17 | Discharge: 2016-08-17 | Discharge status: Against Medical Advice | Payer: Self-pay | Attending: Emergency Medicine | Admitting: Emergency Medicine

## 2016-08-17 ENCOUNTER — Encounter (HOSPITAL_COMMUNITY): Payer: Self-pay | Admitting: Emergency Medicine

## 2016-08-17 DIAGNOSIS — Z5321 Procedure and treatment not carried out due to patient leaving prior to being seen by health care provider: Secondary | ICD-10-CM | POA: Insufficient documentation

## 2016-08-17 DIAGNOSIS — G4700 Insomnia, unspecified: Secondary | ICD-10-CM | POA: Insufficient documentation

## 2016-08-17 NOTE — ED Triage Notes (Signed)
Patient arrives with complaint of insomnia. States hasn't slept for 3 days. Recent death of mother hasn't allowed patient to relax and get rest. History of mental illness with similar problems in past. Denies SI/HI/AVH. Currently mental status normal. Requests recommendations to help him rest.

## 2016-08-17 NOTE — ED Notes (Signed)
Pt came to nurse first with family to advise they are leaving.  Encouraged pt to stay, but declined.  Family said if pt is no better in the am they will take pt to urgent care.

## 2016-08-19 ENCOUNTER — Ambulatory Visit (HOSPITAL_COMMUNITY)
Admission: RE | Admit: 2016-08-19 | Discharge: 2016-08-19 | Disposition: A | Discharge status: Home / Self Care | Payer: 59 | Attending: Psychiatry | Admitting: Psychiatry

## 2016-08-19 ENCOUNTER — Emergency Department (HOSPITAL_COMMUNITY)
Admission: EM | Admit: 2016-08-19 | Discharge: 2016-08-20 | Disposition: A | Discharge status: Other Acute Inpatient Hospital | Payer: Self-pay | Attending: Emergency Medicine | Admitting: Emergency Medicine

## 2016-08-19 ENCOUNTER — Encounter (HOSPITAL_COMMUNITY): Payer: Self-pay | Admitting: Emergency Medicine

## 2016-08-19 DIAGNOSIS — F209 Schizophrenia, unspecified: Secondary | ICD-10-CM | POA: Insufficient documentation

## 2016-08-19 DIAGNOSIS — Z008 Encounter for other general examination: Secondary | ICD-10-CM

## 2016-08-19 DIAGNOSIS — Z72820 Sleep deprivation: Secondary | ICD-10-CM | POA: Insufficient documentation

## 2016-08-19 DIAGNOSIS — F329 Major depressive disorder, single episode, unspecified: Secondary | ICD-10-CM | POA: Diagnosis present

## 2016-08-19 DIAGNOSIS — Z048 Encounter for examination and observation for other specified reasons: Secondary | ICD-10-CM | POA: Insufficient documentation

## 2016-08-19 HISTORY — DX: Schizophrenia, unspecified: F20.9

## 2016-08-19 LAB — CBC
HCT: 41.5 % (ref 39.0–52.0)
HEMOGLOBIN: 15 g/dL (ref 13.0–17.0)
MCH: 30.6 pg (ref 26.0–34.0)
MCHC: 36.1 g/dL — ABNORMAL HIGH (ref 30.0–36.0)
MCV: 84.7 fL (ref 78.0–100.0)
PLATELETS: 209 10*3/uL (ref 150–400)
RBC: 4.9 MIL/uL (ref 4.22–5.81)
RDW: 12 % (ref 11.5–15.5)
WBC: 7.7 10*3/uL (ref 4.0–10.5)

## 2016-08-19 LAB — COMPREHENSIVE METABOLIC PANEL
ALK PHOS: 45 U/L (ref 38–126)
ALT: 14 U/L — ABNORMAL LOW (ref 17–63)
ANION GAP: 8 (ref 5–15)
AST: 18 U/L (ref 15–41)
Albumin: 4.9 g/dL (ref 3.5–5.0)
BILIRUBIN TOTAL: 1.8 mg/dL — AB (ref 0.3–1.2)
BUN: 9 mg/dL (ref 6–20)
CALCIUM: 9.1 mg/dL (ref 8.9–10.3)
CO2: 30 mmol/L (ref 22–32)
Chloride: 101 mmol/L (ref 101–111)
Creatinine, Ser: 0.93 mg/dL (ref 0.61–1.24)
Glucose, Bld: 116 mg/dL — ABNORMAL HIGH (ref 65–99)
POTASSIUM: 3.8 mmol/L (ref 3.5–5.1)
Sodium: 139 mmol/L (ref 135–145)
Total Protein: 8.1 g/dL (ref 6.5–8.1)

## 2016-08-19 LAB — RAPID URINE DRUG SCREEN, HOSP PERFORMED
AMPHETAMINES: NOT DETECTED
Barbiturates: NOT DETECTED
Benzodiazepines: NOT DETECTED
COCAINE: NOT DETECTED
OPIATES: NOT DETECTED
TETRAHYDROCANNABINOL: NOT DETECTED

## 2016-08-19 LAB — ETHANOL

## 2016-08-19 NOTE — BH Assessment (Addendum)
Tele Assessment Note  Pt presents voluntarily to [facility] for evaluation. Pt is pleasant and oriented x 4.  He reports his mother died from complications from diabetes on 08/14/16. He sts he received his monthly Abilify injection by Dr Jannifer Franklin on 08/15/16. Pt reports he hasn't slept in 3 or 4 days. He also reports he hasn't eaten in 3 -4 days. He says his wife is concerned as he has lost unknown amount of weight. Pt says he typically works 5 days a week at a company [employer]. Pt says he moved from [place] to the [place] in 1994. He says he witnessed war often and he often thinks about it. Pt says violent movies remind him of [place]. Per chart review, pt has prior inpatient psych admissions at [facility], [facility] and a facility in [place]. He endorses irritability (d/t insomnia), fatigue and tearfulness. Per chart review, pt often exhibited 3-4 days of insomnia and not eating prior to developing psychosis. Pt denies hallucinations. Pt does not appear to be responding to internal stimuli and exhibits no delusional thought. He reports hx of VH. Pt says he tells himself "It is not your fault", referring to having guilt re: mom's death. Pt denies SI currently or at any time in the past. Pt denies any history of suicide attempts and denies history of self-mutilation. Pt denies homicidal thoughts or physical aggression. Pt denies having access to firearms. Pt denies having any legal problems at this time. Pt denies any current or past substance abuse problems. Pt does not appear to be intoxicated or in withdrawal at this time.  Brent [name] is an 36 y.o. male.   Diagnosis: Schizophrenia  Past Medical History: No past medical history on file.  No past surgical history on file.  Family History:  Family History  Problem Relation Age of Onset  . Mental illness Neg Hx     Social History:  reports that he has never smoked. He has never used smokeless tobacco. He reports that he does not drink alcohol or use  drugs.  Additional Social History:  Alcohol / Drug Use Pain Medications: pt denies abuse - see pta meds list Prescriptions: pt denies abuse - see pta meds list Over the Counter: pt denies abuse - see pta meds list History of alcohol / drug use?: No history of alcohol / drug abuse Longest period of sobriety (when/how long): n/a  CIWA: CIWA-Ar BP: 139/88 Pulse Rate: 61 COWS:    PATIENT STRENGTHS: (choose at least two) Ability for insight Average or above average intelligence Capable of independent living Communication skills Physical Health Supportive family/friends Work skills  Allergies:  Allergies  Allergen Reactions  . Haloperidol Other (See Comments)    Patient experienced acute dystonia after receiving 10 mg dose of haldol. Listed in outside allergies from [facility]    Home Medications:  (Not in a hospital admission)  OB/GYN Status:  No LMP for male patient.  General Assessment Data Location of Assessment: [facility] TTS Assessment: In system Is this a Tele or Face-to-Face Assessment?: Face-to-Face Is this an Initial Assessment or a Re-assessment for this encounter?: Initial Assessment Marital status: Married [place] name: none Is patient pregnant?: No Pregnancy Status: No Living Arrangements: Spouse/significant other (wife) Can pt return to current living arrangement?: Yes Admission Status: Voluntary Is patient capable of signing voluntary admission?: Yes Referral Source: Self/Family/Friend Insurance type: Designer, industrial/product Exam [facility] Walk-in ONLY) Medical Exam completed: Yes Beryle Lathe NP)  Crisis Care Plan Living  Arrangements: Spouse/significant other (wife) Name of Psychiatrist: [place] Name of Therapist: none  Education Status Is patient currently in school?: No  Risk to self with the past 6 months Suicidal Ideation: No Has patient been a risk to self within the past 6 months prior to admission? : No Suicidal Intent:  No Has patient had any suicidal intent within the past 6 months prior to admission? : No Is patient at risk for suicide?: No Suicidal Plan?: No Has patient had any suicidal plan within the past 6 months prior to admission? : No Access to Means: No What has been your use of drugs/alcohol within the last 12 months?: none Previous Attempts/Gestures: No How many times?: 0 Other Self Harm Risks: none Triggers for Past Attempts:  (n/a) Intentional Self Injurious Behavior: None Family Suicide History: No Recent stressful life event(s):  (mom died last week, insomnia) Persecutory voices/beliefs?: No Depression: Yes Depression Symptoms: Insomnia, Tearfulness, Feeling angry/irritable (poor appetite) Substance abuse history and/or treatment for substance abuse?: No Suicide prevention information given to non-admitted patients: Not applicable  Risk to Others within the past 6 months Homicidal Ideation: No Does patient have any lifetime risk of violence toward others beyond the six months prior to admission? : No Thoughts of Harm to Others: No Current Homicidal Intent: No Current Homicidal Plan: No Access to Homicidal Means: No Identified Victim: none History of harm to others?: No Assessment of Violence: None Noted Violent Behavior Description: pt denies hx violence Does patient have access to weapons?: No Criminal Charges Pending?: No Does patient have a court date: No Is patient on probation?: No  Psychosis Hallucinations: None noted (He reports hx of VH) Delusions: None noted  Mental Status Report Appearance/Hygiene: Unremarkable (thin, in appropriate street clothing) Motor Activity: Freedom of movement Speech: Logical/coherent Level of Consciousness: Alert, Quiet/awake Mood: Irritable, Sad Affect: Appropriate to circumstance, Sad Anxiety Level: Minimal Thought Processes: Relevant, Coherent Judgement: Unimpaired Orientation: Person, Place, Time, Situation Obsessive  Compulsive Thoughts/Behaviors: None  Cognitive Functioning Concentration: Normal Memory: Recent Intact, Remote Intact IQ: Average Insight: Good Impulse Control: Good Appetite: Poor Weight Loss:  (pt reports hasn't eaten in 4 days) Sleep: Decreased Total Hours of Sleep: 0 (in 3 or 4 days) Vegetative Symptoms: None  ADLScreening [facility] Assessment Services) Patient's cognitive ability adequate to safely complete daily activities?: Yes Patient able to express need for assistance with ADLs?: Yes Independently performs ADLs?: Yes (appropriate for developmental age)  Prior Inpatient Therapy Prior Inpatient Therapy: Yes Prior Therapy Dates: 2017 & 2018 and unknown earlier year Prior [facility] & [place] Reason for Treatment: schizophrenia, psychosis  Prior Outpatient Therapy Prior Outpatient Therapy: Yes Prior Therapy Dates: currently Prior Therapy Facilty/Provider(s): [facility]ann[facility] Reason for Treatment: med management Does patient have an ACCT team?: No Does patient have Intensive In-House Services?  : No Does patient have [facility] services? : No Does patient have P4CC services?: No  ADL Screening (condition at time of admission) Patient's cognitive ability adequate to safely complete daily activities?: Yes Is the patient deaf or have difficulty hearing?: No Does the patient have difficulty seeing, even when wearing glasses/contacts?: No Does the patient have difficulty concentrating, remembering, or making decisions?: No Patient able to express need for assistance with ADLs?: Yes Does the patient have difficulty dressing or bathing?: No Independently performs ADLs?: Yes (appropriate for developmental age) Does the patient have difficulty walking or climbing stairs?: No Weakness of Legs: None Weakness of Arms/Hands: None  Home  Assistive Devices/Equipment Home Assistive Devices/Equipment: None     Abuse/Neglect Assessment (Assessment to be complete while patient is alone) Physical Abuse: Denies Verbal Abuse: Denies Sexual Abuse: Denies Exploitation of patient/patient's resources: Denies Self-Neglect: Denies     Merchant navy officer (For Healthcare) Does Patient Have a Medical Advance Directive?: No Would patient like information on creating a medical advance directive?: No - Patient declined    Additional Information 1:1 In Past 12 Months?: No CIRT Risk: No Elopement Risk: No Does patient have medical clearance?: No     Disposition:  Disposition Initial Assessment Completed for this Encounter: Yes Disposition of Patient: Inpatient treatment program   Pt going to [facility] for medical clearance. He presented [facility] for evaluation. Per Leighton Ruff NP, pt needs inpatient criteria. AC notified [facility] charge RN and [facility] Burrow was called for transport. [facility] will review for possible placement pending discharges/bed availability. Pt currently does not have a bed at [facility].  Thailyn Khalid P 08/19/2016 2:00 PM

## 2016-08-19 NOTE — ED Triage Notes (Signed)
Pt comes from behavioral health for medical clearance.  Pt states his mother died Monday in her sleep and he has not been able to get out of the bed all week.  Also endorses weight loss. Denies HI/SI.  States he is just stressed and depressed. Hx of psychiatric treatment.  States he gets injections.

## 2016-08-19 NOTE — BHH Counselor (Signed)
[facility] going [facility]. [facility]. Per Leighton Ruffina Okonkwo NP, pt needs inpatient criteria. AC notified [facility] charge RN and [facility] was called for transport.  Evette Cristalaroline Paige Evalyse Stroope, [place] Therapeutic Triage Specialist

## 2016-08-19 NOTE — ED Provider Notes (Signed)
[facility]-EMERGENCY DEPT Provider Note   CSN: [id number] Arrival date & time: 08/19/16  1315     History   Chief Complaint Chief Complaint  Patient presents with  . Medical Clearance  . Depression    HPI Brent [name] is a 36 y.o. male who presents to the emergency department With recent diagnosis of schizophrenia for medical clearance. Patient reports that his mother died on 2016-08-24. He has recently been seen by his primary doctor and being given a injection of Abilify for depression-like symptoms. He reports over the last 3-4 days she has not slept or eaten anything. He notes increased sadness, depression, anxiety. Patient denies hallucinations. Does not appear to be responding to delusions. Denies drug use. Denies suicidal ideation or homicidal ideation. Denies fevers, chills, myalgias, arthralgias., DOE, SOB, chest pain, diaphoresis, dysuria, flank pain, frequency, urgency, or hematuria. headaches, light headedness, weakness, visual disturbances, abdominal pain, nausea, vomiting, diarrhea or constipation.   HPI  Past Medical History:  Diagnosis Date  . Schizophrenia [facility])     Patient Active Problem List   Diagnosis Date Noted  . Schizophrenia (HCC) 02/02/2016    History reviewed. No pertinent surgical history.     Home Medications    Prior to Admission medications   Medication Sig Start Date End Date Taking? Authorizing Provider  ARIPiprazole ER 400 MG SRER Inject 400 mg into the muscle every 30 (thirty) days. Next dose due 2/18 03/03/16  Yes Adonis Brook, NP  ARIPiprazole (ABILIFY) 5 MG tablet Take 1 tablet (5 mg total) by mouth at bedtime. Patient not taking: Reported on 08/19/2016 02/03/16   Adonis Brook, NP  benztropine (COGENTIN) 0.5 MG tablet Take 1 tablet (0.5 mg total) by mouth at bedtime. Patient not taking: Reported on 08/19/2016 02/03/16   Adonis Brook, NP  hydrOXYzine (ATARAX/VISTARIL) 25 MG tablet Take 1 tablet (25 mg total) by mouth every 6 (six)  hours as needed for anxiety. Patient not taking: Reported on 08/19/2016 02/03/16   Adonis Brook, NP  traZODone (DESYREL) 50 MG tablet Take 1 tablet (50 mg total) by mouth at bedtime as needed for sleep. Patient not taking: Reported on 08/19/2016 02/03/16   Adonis Brook, NP    Family History Family History  Problem Relation Age of Onset  . Mental illness Neg Hx     Social History Social History  Substance Use Topics  . Smoking status: Never Smoker  . Smokeless tobacco: Never Used  . Alcohol use No     Allergies   Haloperidol   Review of Systems Review of Systems  All other systems reviewed and are negative.    Physical Exam Updated Vital Signs BP 132/81 (BP Location: Right Arm)   Pulse 71   Temp 98.2 F (36.8 C) (Oral)   Resp 18   Ht 5\' 6"  (1.676 m)   Wt 61.2 kg (135 lb)   SpO2 99%   BMI 21.79 kg/m   Physical Exam  Constitutional: He is oriented to person, place, and time. He appears well-developed and well-nourished.  HENT:  Head: Normocephalic and atraumatic.  Right Ear: External ear normal.  Left Ear: External ear normal.  Nose: Nose normal.  Mouth/Throat: Oropharynx is clear and moist.  Eyes: Pupils are equal, round, and reactive to light. Right eye exhibits no discharge. Left eye exhibits no discharge. No scleral icterus.  Neck: Neck supple.  Cardiovascular: Normal rate, regular rhythm and intact distal pulses.   No murmur heard. Pulses:      Radial pulses are 2+  on the right side, and 2+ on the left side.       Dorsalis pedis pulses are 2+ on the right side, and 2+ on the left side.       Posterior tibial pulses are 2+ on the right side, and 2+ on the left side.  No lower extremity swelling or edema. Calves symmetric in size bilaterally.  Pulmonary/Chest: Effort normal and breath sounds normal. He exhibits no tenderness.  Abdominal: Soft. Bowel sounds are normal. There is no tenderness. There is no rebound and no guarding.  Musculoskeletal: He  exhibits no edema.  Lymphadenopathy:    He has no cervical adenopathy.  Neurological: He is alert and oriented to person, place, and time.  Skin: Skin is warm and dry. No rash noted. He is not diaphoretic.  Psychiatric: He has a normal mood and affect.  Nursing note and vitals reviewed.    ED Treatments / Results  Labs (all labs ordered are listed, but only abnormal results are displayed) Labs Reviewed  COMPREHENSIVE METABOLIC PANEL - Abnormal; Notable for the following:       Result Value   Glucose, Bld 116 (*)    ALT 14 (*)    Total Bilirubin 1.8 (*)    All other components within normal limits  CBC - Abnormal; Notable for the following:    MCHC 36.1 (*)    All other components within normal limits  ETHANOL  RAPID URINE DRUG SCREEN, HOSP PERFORMED    EKG  EKG Interpretation None       Radiology No results found.  Procedures Procedures (including critical care time)  Medications Ordered in ED Medications - No data to display   Initial Impression / Assessment and Plan / ED Course  I have reviewed the triage vital signs and the nursing notes.  Pertinent labs & imaging results that were available during my care of the patient were reviewed by me and considered in my medical decision making (see chart for details).     Pt presents to the ED for medical clearance.  Pt is not currently having SI or HI ideations.   The patients demeanor is dysphoric. Admits difficulty sleeping, lack of energy, loss of appetite, and feelings of anxiety. [facility] and sent over for medical clearance. Patient meets inpatient criteria.   The patient currently does not have any acute physical complaints and is in no acute distress. Labs are reassuring. Vital signs are stable. Patient is mentating appropriately and appears in no acute distress. Patient is medically cleared.   TTS consult placed for placement. Psych hold placed.   Final  Clinical Impressions(s) / ED Diagnoses   Final diagnoses:  Medical clearance for psychiatric admission    New Prescriptions New Prescriptions   No medications on file     Princella PellegriniMaczis, Dawayne Ohair M, PA-C 08/19/16 2119    Little, Ambrose Finlandachel Morgan, MD 08/20/16 [phone]

## 2016-08-19 NOTE — H&P (Signed)
Behavioral Health Medical Screening Exam  Brent [name] is an 36 y.o. male [facility] brother with complaints of worsening depression. Patient recently lost his mother and is reporting lack of sleep for 3 nights and decreased appetite.  Total Time spent with patient: 30 minutes  Psychiatric Specialty Exam: Physical Exam  Vitals reviewed. Constitutional: He is oriented to person, place, and time. He appears well-developed and well-nourished.  HENT:  Head: Normocephalic and atraumatic.  Eyes: Pupils are equal, round, and reactive to light.  Neck: Normal range of motion.  Cardiovascular: Normal rate, regular rhythm and normal heart sounds.   Respiratory: Effort normal and breath sounds normal.  GI: Soft. Bowel sounds are normal.  Genitourinary:  Genitourinary Comments: Deferred   Musculoskeletal: Normal range of motion.  Neurological: He is alert and oriented to person, place, and time.  Skin: Skin is warm and dry.    ROS  Blood pressure 139/88, pulse 61, temperature 98 F (36.7 C), temperature source Oral, resp. rate 18, SpO2 100 %.There is no height or weight on file to calculate BMI.  General Appearance: Casual  Eye Contact:  Good  Speech:  Clear and Coherent and Normal Rate  Volume:  Normal  Mood:  Anxious and Depressed  Affect:  Congruent  Thought Process:  Coherent and Goal Directed  Orientation:  Full (Time, Place, and Person)  Thought Content:  WDL and Logical  Suicidal Thoughts:  No  Homicidal Thoughts:  No  Memory:  Immediate;   Good Recent;   Good Remote;   Fair  Judgement:  Good  Insight:  Present  Psychomotor Activity:  Normal  Concentration: Concentration: Fair and Attention Span: Good  Recall:  Good  Fund of Knowledge:Good  Language: Good  Akathisia:  Negative  Handed:  Right  AIMS (if indicated):     Assets:  Communication Skills Desire for Improvement Financial Resources/Insurance Housing Intimacy Leisure  Time Physical Health Resilience Social Support  Sleep:   lack of sleep for 3 days    Musculoskeletal: Strength & Muscle Tone: within normal limits Gait & Station: normal Patient leans: N/A  Blood pressure 139/88, pulse 61, temperature 98 F (36.7 C), temperature source Oral, resp. rate 18, SpO2 100 %.  Recommendations:  Based on my evaluation the patient appears to have an emergency medical condition for which I recommend the patient be transferred to the emergency department for further evaluation.  Delila PereyraJustina A Okonkwo, NP 08/19/2016, 1:03 PM

## 2016-08-20 ENCOUNTER — Encounter (HOSPITAL_COMMUNITY): Payer: Self-pay

## 2016-08-20 ENCOUNTER — Inpatient Hospital Stay (HOSPITAL_COMMUNITY)
Admission: RE | Admit: 2016-08-20 | Discharge: 2016-08-23 | DRG: 885 | Disposition: A | Discharge status: Home / Self Care | Payer: 59 | Source: Other Acute Inpatient Hospital | Attending: Psychiatry | Admitting: Psychiatry

## 2016-08-20 DIAGNOSIS — Z634 Disappearance and death of family member: Secondary | ICD-10-CM

## 2016-08-20 DIAGNOSIS — R634 Abnormal weight loss: Secondary | ICD-10-CM

## 2016-08-20 DIAGNOSIS — F209 Schizophrenia, unspecified: Secondary | ICD-10-CM | POA: Diagnosis present

## 2016-08-20 DIAGNOSIS — F39 Unspecified mood [affective] disorder: Secondary | ICD-10-CM | POA: Diagnosis not present

## 2016-08-20 DIAGNOSIS — F4312 Post-traumatic stress disorder, chronic: Secondary | ICD-10-CM | POA: Diagnosis present

## 2016-08-20 DIAGNOSIS — Z888 Allergy status to other drugs, medicaments and biological substances status: Secondary | ICD-10-CM | POA: Diagnosis not present

## 2016-08-20 DIAGNOSIS — Z599 Problem related to housing and economic circumstances, unspecified: Secondary | ICD-10-CM | POA: Diagnosis not present

## 2016-08-20 DIAGNOSIS — G4700 Insomnia, unspecified: Secondary | ICD-10-CM | POA: Diagnosis present

## 2016-08-20 DIAGNOSIS — R440 Auditory hallucinations: Secondary | ICD-10-CM

## 2016-08-20 DIAGNOSIS — F332 Major depressive disorder, recurrent severe without psychotic features: Secondary | ICD-10-CM | POA: Diagnosis present

## 2016-08-20 DIAGNOSIS — F4310 Post-traumatic stress disorder, unspecified: Secondary | ICD-10-CM | POA: Diagnosis not present

## 2016-08-20 DIAGNOSIS — F419 Anxiety disorder, unspecified: Secondary | ICD-10-CM | POA: Diagnosis not present

## 2016-08-20 MED ORDER — ZIPRASIDONE MESYLATE 20 MG IM SOLR: 20.0000 mg | INTRAMUSCULAR | Status: DC | PRN

## 2016-08-20 MED ORDER — HYDROXYZINE HCL 25 MG PO TABS: 25.0000 mg | ORAL_TABLET | Freq: Four times a day (QID) | ORAL | Status: DC | PRN

## 2016-08-20 MED ORDER — RISPERIDONE 2 MG PO TBDP: 2.0000 mg | ORAL_TABLET | Freq: Three times a day (TID) | ORAL | Status: DC | PRN

## 2016-08-20 MED ORDER — ALUM & MAG HYDROXIDE-SIMETH 200-200-20 MG/5ML PO SUSP: 30.0000 mL | ORAL | Status: DC | PRN

## 2016-08-20 MED ORDER — TRAZODONE HCL 50 MG PO TABS
50.0000 mg | ORAL_TABLET | Freq: Every day | ORAL | Status: DC
  Administered 2016-08-20: 50 mg via ORAL
  Filled 2016-08-20 (×2): qty 1

## 2016-08-20 MED ORDER — ACETAMINOPHEN 325 MG PO TABS: 650.0000 mg | ORAL_TABLET | Freq: Four times a day (QID) | ORAL | Status: DC | PRN

## 2016-08-20 MED ORDER — QUETIAPINE FUMARATE 50 MG PO TABS
50.0000 mg | ORAL_TABLET | Freq: Two times a day (BID) | ORAL | Status: DC
  Administered 2016-08-20 – 2016-08-23 (×7): 50 mg via ORAL
  Filled 2016-08-20 (×12): qty 1

## 2016-08-20 MED ORDER — MAGNESIUM HYDROXIDE 400 MG/5ML PO SUSP: 30.0000 mL | Freq: Every day | ORAL | Status: DC | PRN

## 2016-08-20 MED ORDER — LORAZEPAM 1 MG PO TABS: 1.0000 mg | ORAL_TABLET | ORAL | Status: DC | PRN

## 2016-08-20 MED ORDER — TRAZODONE HCL 50 MG PO TABS
50.0000 mg | ORAL_TABLET | Freq: Every evening | ORAL | Status: DC | PRN
  Administered 2016-08-20 – 2016-08-22 (×3): 50 mg via ORAL
  Filled 2016-08-20 (×10): qty 1

## 2016-08-20 MED ORDER — LAMOTRIGINE 25 MG PO TABS
25.0000 mg | ORAL_TABLET | Freq: Every day | ORAL | Status: DC
  Administered 2016-08-20 – 2016-08-23 (×4): 25 mg via ORAL
  Filled 2016-08-20 (×7): qty 1

## 2016-08-20 MED ORDER — ENSURE ENLIVE PO LIQD
237.0000 mL | Freq: Two times a day (BID) | ORAL | Status: DC
  Administered 2016-08-21 – 2016-08-23 (×5): 237 mL via ORAL

## 2016-08-20 NOTE — Tx Team (Signed)
Initial Treatment Plan 08/20/2016 1:19 AM Brenen Gildardo PoundsAhmed Tamayo ZOX:[id number]    PATIENT STRESSORS: Loss of Mother passed away last monday Other: Hasn't slept in 3 nights   PATIENT STRENGTHS: Capable of independent living MetallurgistCommunication skills Financial means General fund of knowledge Motivation for treatment/growth Physical Health Supportive family/friends   PATIENT IDENTIFIED PROBLEMS: Depression  Difficulty Sleeping  Paranoia/racing thoughts  "I just want to be able to sleep and not think about anything"  "Not stress over things that I cannot control"             DISCHARGE CRITERIA:  Improved stabilization in mood, thinking, and/or behavior Verbal commitment to aftercare and medication compliance  PRELIMINARY DISCHARGE PLAN: Outpatient therapy Medication management  PATIENT/FAMILY INVOLVEMENT: This treatment plan has been presented to and reviewed with the patient, Brent [name].  The patient and family have been given the opportunity to ask questions and make suggestions.  Levin BaconHeather V Jalien Weakland, RN 08/20/2016, 1:19 AM

## 2016-08-20 NOTE — [facility] Suicide Risk Assessment (Signed)
[facility]   Nursing information obtained from:  Patient and chart  Demographic factors:  36 year old male, employed,married  Current Mental Status:  See below  Loss Factors:  Death of mother recently  Historical Factors: Has been diagnosed with Schizophrenia in the past, history of prior psychiatric admissions  Risk Reduction Factors:  Living with another person, especially a relative, employed   Total Time spent with patient: 45 minutes Principal Problem:  MDD  Diagnosis:   Patient Active Problem List   Diagnosis Date Noted  . MDD (major depressive disorder), recurrent episode, severe (HCC) [F33.2] 08/20/2016  . Schizophrenia (HCC) [F20.9] 02/02/2016    Continued Clinical Symptoms:  Alcohol Use Disorder Identification Test Final Score (AUDIT): 0 The "Alcohol Use Disorders Identification Test", Guidelines for Use in Primary Care, Second Edition.  World Science writerHealth Organization [facility](WHO). Score between 0-7:  no or low risk or alcohol related problems. Score between 8-15:  moderate risk of alcohol related problems. Score between 16-19:  high risk of alcohol related problems. Score 20 or above:  warrants further diagnostic evaluation for alcohol dependence and treatment.   CLINICAL FACTORS:  36 year old married male, employed, originally from [place]. He reports his mother passed away recently after which he has had significant insomnia, anxiety, poor appetite, depression. Has been diagnosed with schizophrenia in the past , but at this time not endorsing active psychotic symptoms, and no thought disorder is noted. He is on Abilify IM monthly.   Psychiatric Specialty Exam: Physical Exam  ROS  Blood pressure 125/75, pulse (!) 52, temperature 98 F (36.7 C), temperature source Oral, resp. rate 18, height 5\' 4"  (1.626 m), weight 63.5 kg (140 lb).Body mass index is 24.03 kg/m.   see admit note MSE     COGNITIVE FEATURES THAT CONTRIBUTE TO RISK:  Closed-mindedness  and Loss of executive function    SUICIDE RISK:  Moderate  PLAN OF CARE: Patient will be admitted to inpatient psychiatric unit for stabilization and safety. Will provide and encourage milieu participation. Provide medication management and maked adjustments as needed.  Will follow daily.    I certify that inpatient services furnished can reasonably be expected to improve the patient's condition.   Craige CottaFernando A Asha Grumbine, MD 08/20/2016, 1:35 PM

## 2016-08-20 NOTE — ED Notes (Signed)
[facility] [place] [facility] for transport

## 2016-08-20 NOTE — Progress Notes (Signed)
Lemuel is a 36 year old male being admitted voluntarily to [facility]  He came in because he hasn't slept in three nights because his mother passed away.  He denies any SI/HI or A/V hallucinations.  "I am just tired but my mind won't shut down."  He reported having a head aches 7/10.  He is diagnosed with Schizophrenia.  Oriented him to the unit.  Admission paperwork completed and signed.  Belongings searched and secured in locker # 33.  Skin assessment completed and no skin issues noted.  Q 15 minute checks initiated for safety.  We will monitor the progress towards his goals.

## 2016-08-20 NOTE — Progress Notes (Signed)
Nutrition Brief Note  Patient identified on the Malnutrition Screening Tool (MST) Report  Wt Readings from Last 15 Encounters:  08/20/16 140 lb (63.5 kg)  08/19/16 135 lb (61.2 kg)  02/01/16 141 lb (64 kg)  04/03/15 125 lb (56.7 kg)    Body mass index is 24.03 kg/m. Patient meets criteria for normal based on current BMI.   Labs and medications reviewed.   No nutrition interventions warranted at this time. If nutrition issues arise, please consult RD.   Tilda FrancoLindsey Jarron Curley, MS, RD, LDN Pager: [phone] After Hours Pager: [phone]

## 2016-08-20 NOTE — Progress Notes (Signed)
Patient in bed at the beginning of the shift. His family members visited. He appeared sad and depressed. Patient that he has not been able to eat due to poor appetite. He appeared malnourished. He denied SI/Hi and denied Hallucinations. Writer notified Pa. Ensure was ordered. Patient received his HS medication without difficulty. Q 15 minute check continues for safety

## 2016-08-20 NOTE — [facility] Counselor (Signed)
Adult Comprehensive Assessment  Patient ID: Charleston Pootbuu A Klunk, male   DOB: 08/18/1980, 36 y.o.   MRN: [id number]  Information Source: Information source: Patient  Current Stressors:  Employment / Job issues: Working two jobs. 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 Family Relationships: None reported. Financial / Lack of resources (include bankruptcy): States that finances are tight. Grief/Bereavement: mother died 6 days ago from complications with diabetes  Living/Environment/Situation:  Living Arrangements: Spouse/significant other  Living conditions (as described by patient or guardian): good How long has patient lived in current situation?: 5 years What is atmosphere in current home: Comfortable, Supportive, Loving  Family History:  Marital status: Married Number of Years Married: 7 What types of issues is patient dealing with in the relationship?: none Are you sexually active?: Yes What is your sexual orientation?: hetero Does patient have children?: No  Childhood History:  By whom was/is the patient raised?: Both parents Description of patient's relationship with caregiver when they were a child: good Patient's description of current relationship with people who raised him/her: good Does patient have siblings?: Yes Number of Siblings: 9 Description of patient's current relationship with siblings: "I'm the oldest one. I get along well with everytone." Did patient suffer any verbal/emotional/physical/sexual abuse as a child?: Yes ("Many bad things were happening in [place], which is why we came [place] the states.") Did patient suffer from severe childhood neglect?: No Has patient ever been sexually abused/assaulted/raped as an adolescent or adult?: No Was the patient ever a victim of a crime or a disaster?: No Witnessed domestic violence?: No Has patient been effected by domestic violence as an adult?: No  Education:   Highest grade of school patient has completed: 12 from [employer] Currently a student?: No Name of school: NA Contact person: NA Learning disability?: No  Employment/Work Situation:  Employment situation: Employed 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 How long has patient been employed?: 15 years Patient's job has been impacted by current illness: Yes Describe how patient's job has been impacted: "Was not able to sleep, so unable to focus.  I missed 3 days of work." What is the longest time patient has a held a job?: see above Where was the patient employed at that time?: see above Has patient ever been in the Eli Lilly and Companymilitary?: No Are There Guns or Other Weapons in Your Home?: No  Financial Resources:  Financial resources: Income from employment; Media plannerrivate Insurance Does patient have a Lawyerrepresentative payee or guardian?: No  Alcohol/Substance Abuse:  What has been your use of drugs/alcohol within the last 12 months?: denies Alcohol/Substance Abuse Treatment Hx: Denies past history Has alcohol/substance abuse ever caused legal problems?: No  Social Support System: Conservation officer, natureatient's Community Support System: Good Describe Community Support System: wife, her friends, my family Type of faith/religion: Muslim How does patient's faith help to cope with current illness?: I pray every day. It takes away bad things-like stress.  Leisure/Recreation:  Leisure and Hobbies: play soccer,play basketball  Strengths/Needs:  What things does the patient do well?: sports, responsible In what areas does patient struggle / problems for patient: "every morning getting up to get to work"  Discharge Plan:  Does patient have access to transportation?: Yes Will patient be returning to same living situation after discharge?: Yes Currently receiving community mental health services: Yes At [facility] (receives monthly  Abilify injection)  If no,  would patient like referral for services when discharged?: No Does patient have financial barriers related to discharge medications?: No  Summary/Recommendations:   Patient is a 36 year old male with a diagnosis of Major Depressive Disorder and Schizophrenia by history. Pt presented to the hospital with increased depression and anxiety. Pt reports primary trigger(s) for admission include his mother's death six days prior to admission. Patient will benefit from crisis stabilization, medication evaluation, group therapy and psycho education in addition to case management for discharge planning. At discharge it is recommended that Pt remain compliant with established discharge plan and continued treatment.   Vernie Shanks, LCSW Clinical Social Work [phone]

## 2016-08-20 NOTE — [facility] Group Notes (Signed)
[facility] LCSW Group Therapy 08/20/2016 1:15 PM  Type of Therapy: Group Therapy- Feelings about Diagnosis  Pt did not attend, declined invitation.   Kyron Schlitt, LCSW 08/20/2016 4:18 PM  

## 2016-08-20 NOTE — Plan of Care (Signed)
Problem: Activity: Goal: Interest or engagement in activities will improve Outcome: Not Progressing Patient refused to attend groups today, was reclusive to his room, had limited interactions with staff and peers. Goal: Sleeping patterns will improve Outcome: Not Progressing Pt reported poor sleep last night, and has been in bed all day today

## 2016-08-20 NOTE — H&P (Signed)
Psychiatric Admission Assessment Adult  Patient Identification: Brent [name] MRN:  [id number] Date of Evaluation:  08/20/2016 Chief Complaint: " My mother passed away"    Principal Diagnosis:MDD, no psychotic features, Schizophrenia by history  Diagnosis:   Patient Active Problem List   Diagnosis Date Noted  . MDD (major depressive disorder), recurrent episode, severe ([place]) [F33.2] 08/20/2016  . Schizophrenia ([place]) [F20.9] 02/02/2016   History of Present Illness: 36 year old male. Reports his mother died about a week ago, after which he has been feeling increasingly depressed . In particular, he describes insomnia as his major symptom/complaint, and states he has been unable to sleep much since she passed away. Attributes worsening depression to the insomnia . He states " when I can't sleep I get depressed and very anxious".  Of note, patient has been diagnosed with Schizophrenia in the past, and is on Abilify SRER 400 mgr IM Q Month. States he received last dose about 2- 3 weeks ago. He denies  , however, current psychotic symptoms and does not appear internally preoccupied , no delusions are expressed . As above, he endorses mainly depression, anxiety, and neuro-vegetative symptoms ( poor sleep is major complaint) , but also endorses poor energy level and poor appetite.  Associated Signs/Symptoms: Depression Symptoms:  depressed mood, anhedonia, insomnia, loss of energy/fatigue, decreased appetite, (Hypo) Manic Symptoms:  Denies  Anxiety Symptoms:  Reports increased anxiety since his mother died and states this is partially because he is an elder son, and therefore he has more responsibilities to his family now. Psychotic Symptoms:  He endorses prior history of psychosis " hearing voices", but denies any recent hallucinatory experiences and does not appear internally preoccupied  PTSD Symptoms: History of being exposed to war as a child growing up in [place]- describes some PTSD  symptoms, mainly avoidance, states for example that  he avoids TV shows that are even slightly violent or loud . Denies nightmares  Total Time spent with patient: 45 minutes  Past Psychiatric History: patient has a history of prior psychiatric admissions . In the past he has been diagnosed with Schizophrenia . His most recent psychiatric admission was January 2018. He denies history of suicide attempts. As above endorses history of PTSD symptoms in the past .  Is the patient at risk to self? Yes.    Has the patient been a risk to self in the past 6 months? Yes.    Has the patient been a risk to self within the distant past? No.  Is the patient a risk to others? No.  Has the patient been a risk to others in the past 6 months? No.  Has the patient been a risk to others within the distant past? No.   Prior Inpatient Therapy:  as above  Prior Outpatient Therapy:  patient follows up with Dr. Darleene Cleaver for outpatient psychiatric treatment  Alcohol Screening: 1. How often do you have a drink containing alcohol?: Never 9. Have you or someone else been injured as a result of your drinking?: No 10. Has a relative or friend or a doctor or another health worker been concerned about your drinking or suggested you cut down?: No Alcohol Use Disorder Identification Test Final Score (AUDIT): 0 Brief Intervention: AUDIT score less than 7 or less-screening does not suggest unhealthy drinking-brief intervention not indicated Substance Abuse History in the last 12 months: denies any alcohol or drug abuse or dependence  Consequences of Substance Abuse: Denies  Previous Psychotropic Medications: reports he is on  Abilify SRER 400 mgrs Q4 weeks, states his last injection was about 2 weeks ago. Currently not taking any other standing psychiatric medications .  Psychological Evaluations:  Denies  Past Medical History: He denies any medical illness  Past Medical History:  Diagnosis Date  . Schizophrenia ([place])     History reviewed. No pertinent surgical history. Family History: mother recently passed away about a week ago  . Denies any history of mental illness in family.  Family History  Problem Relation Age of Onset  . Mental illness Neg Hx    Family Psychiatric  History: see above  Tobacco Screening: Have you used any form of tobacco in the last 30 days? (Cigarettes, Smokeless Tobacco, Cigars, and/or Pipes): No Social History: patient is married, has no children, is employed. Denies legal issues . He came from [place] in the 90s.  Major stressor at this time is recent death of his mother.  History  Alcohol Use No     History  Drug Use No    Additional Social History:  Allergies:   Allergies  Allergen Reactions  . Haloperidol Other (See Comments)    Patient experienced acute dystonia after receiving 10 mg dose of haldol. Listed in outside allergies from [facility]   Lab Results:  Results for orders placed or performed during the hospital encounter of 08/19/16 (from the past 48 hour(s))  Rapid urine drug screen (hospital performed)     Status: None   Collection Time: 08/19/16  2:27 PM  Result Value Ref Range   Opiates NONE DETECTED NONE DETECTED   Cocaine NONE DETECTED NONE DETECTED   Benzodiazepines NONE DETECTED NONE DETECTED   Amphetamines NONE DETECTED NONE DETECTED   Tetrahydrocannabinol NONE DETECTED NONE DETECTED   Barbiturates NONE DETECTED NONE DETECTED    Comment:        DRUG SCREEN FOR MEDICAL PURPOSES ONLY.  IF CONFIRMATION IS NEEDED FOR ANY PURPOSE, NOTIFY LAB WITHIN 5 DAYS.        LOWEST DETECTABLE LIMITS FOR URINE DRUG SCREEN Drug Class       Cutoff (ng/mL) Amphetamine      1000 Barbiturate      200 Benzodiazepine   790 Tricyclics       383 Opiates          300 Cocaine          300 THC              50   Comprehensive metabolic panel     Status: Abnormal   Collection Time: 08/19/16  3:37 PM  Result Value Ref Range   Sodium 139 135 - 145 mmol/L    Potassium 3.8 3.5 - 5.1 mmol/L   Chloride 101 101 - 111 mmol/L   CO2 30 22 - 32 mmol/L   Glucose, Bld 116 (H) 65 - 99 mg/dL   BUN 9 6 - 20 mg/dL   Creatinine, Ser 0.93 0.61 - 1.24 mg/dL   Calcium 9.1 8.9 - 10.3 mg/dL   Total Protein 8.1 6.5 - 8.1 g/dL   Albumin 4.9 3.5 - 5.0 g/dL   AST 18 15 - 41 U/L   ALT 14 (L) 17 - 63 U/L   Alkaline Phosphatase 45 38 - 126 U/L   Total Bilirubin 1.8 (H) 0.3 - 1.2 mg/dL   GFR calc non Af Amer >60 >60 mL/min   GFR calc Af Amer >60 >60 mL/min    Comment: (NOTE) The eGFR has been calculated using the CKD EPI equation.  This calculation has not been validated in all clinical situations. eGFR's persistently <60 mL/min signify possible Chronic Kidney Disease.    Anion gap 8 5 - 15  Ethanol     Status: None   Collection Time: 08/19/16  3:37 PM  Result Value Ref Range   Alcohol, Ethyl (B) <5 <5 mg/dL    Comment:        LOWEST DETECTABLE LIMIT FOR SERUM ALCOHOL IS 5 mg/dL FOR MEDICAL PURPOSES ONLY   cbc     Status: Abnormal   Collection Time: 08/19/16  3:37 PM  Result Value Ref Range   WBC 7.7 4.0 - 10.5 K/uL   RBC 4.90 4.22 - 5.81 MIL/uL   Hemoglobin 15.0 13.0 - 17.0 g/dL   HCT 41.5 39.0 - 52.0 %   MCV 84.7 78.0 - 100.0 fL   MCH 30.6 26.0 - 34.0 pg   MCHC 36.1 (H) 30.0 - 36.0 g/dL   RDW 12.0 11.5 - 15.5 %   Platelets 209 150 - 400 K/uL    Blood Alcohol level:  Lab Results  Component Value Date   ETH <5 08/19/2016   ETH <5 17/00/1749    Metabolic Disorder Labs:  Lab Results  Component Value Date   HGBA1C 4.6 (L) 02/02/2016   MPG 85 02/02/2016   MPG 94 04/05/2015   Lab Results  Component Value Date   PROLACTIN 17.0 (H) 02/02/2016   PROLACTIN 15.3 (H) 04/05/2015   Lab Results  Component Value Date   CHOL 161 02/02/2016   TRIG 81 02/02/2016   HDL 41 02/02/2016   CHOLHDL 3.9 02/02/2016   VLDL 16 02/02/2016   LDLCALC 104 (H) 02/02/2016   [place] 95 04/05/2015    Current Medications: Current Facility-Administered  Medications  Medication Dose Route Frequency Provider Last Rate Last Dose  . acetaminophen (TYLENOL) tablet 650 mg  650 mg Oral Q6H PRN Laverle Hobby, PA-C      . alum & mag hydroxide-simeth (MAALOX/MYLANTA) 200-200-20 MG/5ML suspension 30 mL  30 mL Oral Q4H PRN Laverle Hobby, PA-C      . hydrOXYzine (ATARAX/VISTARIL) tablet 25 mg  25 mg Oral Q6H PRN Laverle Hobby, PA-C      . lamoTRIgine (LAMICTAL) tablet 25 mg  25 mg Oral Daily Simon, Spencer E, PA-C      . risperiDONE (RISPERDAL M-TABS) disintegrating tablet 2 mg  2 mg Oral Q8H PRN Laverle Hobby, PA-C       And  . LORazepam (ATIVAN) tablet 1 mg  1 mg Oral PRN Patriciaann Clan E, PA-C       And  . ziprasidone (GEODON) injection 20 mg  20 mg Intramuscular PRN Patriciaann Clan E, PA-C      . magnesium hydroxide (MILK OF MAGNESIA) suspension 30 mL  30 mL Oral Daily PRN Laverle Hobby, PA-C      . QUEtiapine (SEROQUEL) tablet 50 mg  50 mg Oral BID Simon, Spencer E, PA-C      . traZODone (DESYREL) tablet 50 mg  50 mg Oral QHS,MR X 1 Simon, Spencer E, PA-C       PTA Medications: Prescriptions Prior to Admission  Medication Sig Dispense Refill Last Dose  . ARIPiprazole (ABILIFY) 5 MG tablet Take 1 tablet (5 mg total) by mouth at bedtime. (Patient not taking: Reported on 08/19/2016) 30 tablet 0 Not Taking at Unknown time  . ARIPiprazole ER 400 MG SRER Inject 400 mg into the muscle every 30 (thirty) days. Next dose due  2/18 1 each 0 Past Week at Unknown time  . benztropine (COGENTIN) 0.5 MG tablet Take 1 tablet (0.5 mg total) by mouth at bedtime. (Patient not taking: Reported on 08/19/2016) 30 tablet 0 Not Taking at Unknown time  . cyproheptadine (PERIACTIN) 4 MG tablet Take 4 mg by mouth daily as needed (appepitite stimulation).   unknown at Unknown time  . hydrOXYzine (ATARAX/VISTARIL) 25 MG tablet Take 1 tablet (25 mg total) by mouth every 6 (six) hours as needed for anxiety. (Patient not taking: Reported on 08/19/2016) 30 tablet 0 Not Taking  at Unknown time  . traZODone (DESYREL) 50 MG tablet Take 1 tablet (50 mg total) by mouth at bedtime as needed for sleep. (Patient not taking: Reported on 08/19/2016) 30 tablet 0 Not Taking at Unknown time    Musculoskeletal: Strength & Muscle Tone: within normal limits Gait & Station: normal Patient leans: N/A  Psychiatric Specialty Exam: Physical Exam  Review of Systems  Constitutional: Positive for weight loss.  HENT: Negative.   Eyes: Negative.   Respiratory: Negative.   Cardiovascular: Negative.   Gastrointestinal: Negative.   Genitourinary: Negative.   Musculoskeletal: Negative.   Skin: Negative.   Neurological: Negative.   Endo/Heme/Allergies: Negative.   Psychiatric/Behavioral: Positive for depression. Negative for hallucinations. The patient is nervous/anxious and has insomnia.   All other systems reviewed and are negative.   Blood pressure 125/75, pulse (!) 52, temperature 98 F (36.7 C), temperature source Oral, resp. rate 18, height '5\' 4"'  (1.626 m), weight 63.5 kg (140 lb).Body mass index is 24.03 kg/m.  General Appearance: Fairly Groomed  Eye Contact:  Good  Speech:  Normal Rate  Volume:  Decreased  Mood:  report some depression, sadness, which he attributes to recent death of loved one  Affect:  milldy constricted, anxious   Thought Process:  Linear  Orientation:  Other:  fully alert and attentive   Thought Content:  at this time denies hallucinations, and does not appear internally preoccupied, no delusions are noted  Suicidal Thoughts:  No currently denies suicidal or self injurious ideations and contracts for safety on unit, also denies homicidal or violent ideations   Homicidal Thoughts:  No  Memory:  recent and remote grossly intact  Judgement:  Fair  Insight:  Present  Psychomotor Activity:  Decreased  Concentration:  Concentration: Good and Attention Span: Good  Recall:  Good  Fund of Knowledge:  Good  Language:  Good  Akathisia:  Negative  Handed:   Right  AIMS (if indicated):     Assets:  Desire for Improvement Resilience  ADL's:  Intact  Cognition:  WNL  Sleep:  Number of Hours: 3.25    Treatment Plan Summary: Daily contact with patient to assess and evaluate symptoms and progress in treatment, Medication management, Plan inpatient treatment  and medications as below  Observation Level/Precautions:  15 minute checks  Laboratory:  as needed   Psychotherapy:  Milieu, support , groups  Medications:  As noted, patient reports he has been compliant with ABILIFY SRER  400 mgrs IM monthly and that his last dose was about two weeks ago.  He agrees to [place] trial for depression and insomnia, states he thinks he was on this medication in the past, and did not have side effects  Consultations: as needed     Discharge Concerns: -    Estimated LOS: 5-6 days   Other:     Physician Treatment Plan for Primary Diagnosis:  MDD, without psychotic features - bereavement  Long Term Goal(s): Improvement in symptoms so as ready for discharge  Short Term Goals: Ability to verbalize feelings will improve, Ability to disclose and discuss suicidal ideas, Ability to demonstrate self-control will improve, Ability to identify and develop effective coping behaviors will improve, Ability to maintain clinical measurements within normal limits will improve and Compliance with prescribed medications will improve  Physician Treatment Plan for Secondary Diagnosis: Schizophrenia by history  Long Term Goal(s): Improvement in symptoms so as ready for discharge  Short Term Goals: Ability to identify changes in lifestyle to reduce recurrence of condition will improve and Ability to maintain clinical measurements within normal limits will improve  I certify that inpatient services furnished can reasonably be expected to improve the patient's condition.    Jenne Campus, MD 8/7/20189:49 AM

## 2016-08-20 NOTE — Progress Notes (Signed)
Patient ID: Brent [name], male   DOB: 06/19/1980, 36 y.o.   MRN: [id number] Patient has been reclusive to his room all day, has limited interactions with staff and peers, appears to be guarded, affect is blunted, denies SI/HI/AVH, but states that he is depressed because his mother died a week ago, and he is the oldest of his siblings.  Pt encouraged to complete self inventory sheet, but had not completed it even though it was handed to him by previous shift.  Pt given Ensure in the morning as he reported a poor appetite, and ate lunch with positive verbal reinforcements from staff.  Q15 minute checks are in place, will continue to monitor.

## 2016-08-21 DIAGNOSIS — F39 Unspecified mood [affective] disorder: Secondary | ICD-10-CM

## 2016-08-21 NOTE — [facility] Group Notes (Signed)
[facility] 08/21/2016 1:15pm  Type of Therapy: Mental Health Association Presentation  Pt did not attend, declined invitation.   Vernie ShanksLauren Ralphael Southgate, LCSW 08/21/2016 1:41 PM

## 2016-08-21 NOTE — Progress Notes (Signed)
Recreation Therapy Notes  Date: 08/21/2016 Time: 9:30am Location: [address] Dayroom  Group Topic: Stress Management  Goal Area(s) Addresses:  Patient will verbalize importance of using healthy stress management.  Patient will identify positive emotions associated with healthy stress management.   Intervention: Stress Management  Activity : Guided Energy Starter. Recreation Therapy Intern introduced the stress management technique of a guided energy starter. Recreation Therapy Intern read a script that allowed patients to work on stretching and relaxing some muscles to help them feel energized. Recreation Therapy Intern played calming music. Patients were to follow along as script was read to engage in the activity.  Education: Stress Management, Discharge Planning.   Education Outcome: Needs additional edcuation  Clinical Observations/Feedback: Pt did not attend group.  Rachel Meyer, Recreation Therapy Intern   Ailed Defibaugh, LRT/CTRS  

## 2016-08-21 NOTE — Progress Notes (Signed)
[facility]  08/21/2016 7:44 PM Brent [name]  MRN:  [id number] Subjective:  Patient reports that he is feeling well today and has had improvements since admission. He denies any SI/HI/AVH. He admits depression due to his mother's death and states that it is tough since he spent a lot of time with her and now she isn't there to go see when he wants to. He is still happy that he has his dad to go see. He admits to some financial issues and feels like he has a lot of bill to catch up on, but is starting to feel better about it.  Objective: Patient is pleasant and cooperative. He appears depressed, but is able to communicate well. He keeps a flat affect while talking. He agrees that a couple more days and he should be able to go home and go back to work.  Principal Problem: MDD (major depressive disorder), recurrent episode, severe ([facility]) Diagnosis:   Patient Active Problem List   Diagnosis Date Noted  . MDD (major depressive disorder), recurrent episode, severe ([facility]) [F33.2] 08/20/2016  . Schizophrenia ([facility]) [F20.9] 02/02/2016   Total Time spent with patient: 25 minutes  Past Psychiatric History: See H&P  Past Medical History:  Past Medical History:  Diagnosis Date  . Schizophrenia ([facility])    History reviewed. No pertinent surgical history. Family History:  Family History  Problem Relation Age of Onset  . Mental illness Neg Hx    Family Psychiatric  History: See H&P Social History:  History  Alcohol Use No     History  Drug Use No    Social History   Social History  . Marital status: Single    Spouse name: N/A  . Number of children: N/A  . Years of education: N/A   Social History Main Topics  . Smoking status: Never Smoker  . Smokeless tobacco: Never Used  . Alcohol use No  . Drug use: No  . Sexual activity: Not Asked   Other Topics Concern  . None   Social History Narrative  . None   Additional Social History:                          Sleep: Good  Appetite:  Good  Current Medications: Current Facility-Administered Medications  Medication Dose Route Frequency Provider Last Rate Last Dose  . acetaminophen (TYLENOL) tablet 650 mg  650 mg Oral Q6H PRN Kerry HoughSimon, Spencer E, PA-C      . alum & mag hydroxide-simeth (MAALOX/MYLANTA) 200-200-20 MG/5ML suspension 30 mL  30 mL Oral Q4H PRN Donell SievertSimon, Spencer E, PA-C      . feeding supplement (ENSURE ENLIVE) (ENSURE ENLIVE) liquid 237 mL  237 mL Oral BID BM Simon, Spencer E, PA-C   237 mL at 08/21/16 1608  . hydrOXYzine (ATARAX/VISTARIL) tablet 25 mg  25 mg Oral Q6H PRN Kerry HoughSimon, Spencer E, PA-C      . lamoTRIgine (LAMICTAL) tablet 25 mg  25 mg Oral Daily Donell SievertSimon, Spencer E, PA-C   25 mg at 08/21/16 [id number]  . risperiDONE (RISPERDAL M-TABS) disintegrating tablet 2 mg  2 mg Oral Q8H PRN Kerry HoughSimon, Spencer E, PA-C       And  . LORazepam (ATIVAN) tablet 1 mg  1 mg Oral PRN Donell SievertSimon, Spencer E, PA-C       And  . ziprasidone (GEODON) injection 20 mg  20 mg Intramuscular PRN Donell SievertSimon, Spencer E, PA-C      . magnesium  hydroxide (MILK OF MAGNESIA) suspension 30 mL  30 mL Oral Daily PRN Donell Sievert E, PA-C      . QUEtiapine (SEROQUEL) tablet 50 mg  50 mg Oral BID Donell Sievert E, PA-C   50 mg at 08/21/16 1654  . traZODone (DESYREL) tablet 50 mg  50 mg Oral QHS,MR X 1 Kerry Hough, PA-C   50 mg at 08/20/16 2109    Lab Results: No results found for this or any previous visit (from the past 48 hour(s)).  Blood Alcohol level:  Lab Results  Component Value Date   ETH <5 08/19/2016   ETH <5 04/05/2015    Metabolic Disorder Labs: Lab Results  Component Value Date   HGBA1C 4.6 (L) 02/02/2016   MPG 85 02/02/2016   MPG 94 04/05/2015   Lab Results  Component Value Date   PROLACTIN 17.0 (H) 02/02/2016   PROLACTIN 15.3 (H) 04/05/2015   Lab Results  Component Value Date   CHOL 161 02/02/2016   TRIG 81 02/02/2016   HDL 41 02/02/2016   CHOLHDL 3.9 02/02/2016   VLDL 16 02/02/2016   LDLCALC 104 (H)  02/02/2016   LDLCALC 95 04/05/2015    Physical Findings: AIMS: Facial and Oral Movements Muscles of Facial Expression: None, normal Lips and Perioral Area: None, normal Jaw: None, normal Tongue: None, normal,Extremity Movements Upper (arms, wrists, hands, fingers): None, normal Lower (legs, knees, ankles, toes): None, normal, Trunk Movements Neck, shoulders, hips: None, normal, Overall Severity Severity of abnormal movements (highest score from questions above): None, normal Incapacitation due to abnormal movements: None, normal Patient's awareness of abnormal movements (rate only patient's report): No Awareness, Dental Status Current problems with teeth and/or dentures?: No Does patient usually wear dentures?: No  CIWA:    COWS:     Musculoskeletal: Strength & Muscle Tone: within normal limits Gait & Station: normal Patient leans: N/A  Psychiatric Specialty Exam: Physical Exam  Nursing note and vitals reviewed. Constitutional: He is oriented to person, place, and time. He appears well-developed and well-nourished.  Cardiovascular: Normal rate.   Respiratory: Effort normal.  Musculoskeletal: Normal range of motion.  Neurological: He is alert and oriented to person, place, and time.  Skin: Skin is warm.    Review of Systems  Constitutional: Negative.   HENT: Negative.   Eyes: Negative.   Respiratory: Negative.   Cardiovascular: Negative.   Gastrointestinal: Negative.   Genitourinary: Negative.   Musculoskeletal: Negative.   Skin: Negative.   Neurological: Negative.   Endo/Heme/Allergies: Negative.     Blood pressure 105/74, pulse 95, temperature 97.8 F (36.6 C), temperature source Oral, resp. rate 17, height 5\' 4"  (1.626 m), weight 63.5 kg (140 lb).Body mass index is 24.03 kg/m.  General Appearance: Casual  Eye Contact:  Good  Speech:  Clear and Coherent and Normal Rate  Volume:  Normal  Mood:  Depressed  Affect:  Flat  Thought Process:  Coherent and  Descriptions of Associations: Intact  Orientation:  Full (Time, Place, and Person)  Thought Content:  WDL  Suicidal Thoughts:  No  Homicidal Thoughts:  No  Memory:  Immediate;   NA Recent;   Good  Judgement:  Good  Insight:  Good  Psychomotor Activity:  Normal  Concentration:  Concentration: Good and Attention Span: Good  Recall:  Good  Fund of Knowledge:  Good  Language:  Good  Akathisia:  No  Handed:  Right  AIMS (if indicated):     Assets:  Health and safety inspector Housing Social Support  ADL's:  Intact  Cognition:  WNL  Sleep:  Number of Hours: 3.25     Treatment Plan Summary: Daily contact with patient to assess and evaluate symptoms and progress in treatment, Medication management and Plan is to:  -Continue Hydroxyzine 25 mg PO Q6H PRN for anxiety -Continue Lamictal 25 mg PO Daily for mood stability -Continue Seroquel 50 mg PO BID for mood stability -Encourage group therapy participation -Continue Risperidone M-tabs, Ativan and Geodon PRN for agitation/psychosis.  Maryfrances Bunnell, FNP 08/21/2016, 7:44 PM   Agree with NP Progress Note

## 2016-08-21 NOTE — [facility] Suicide Risk Assessment (Signed)
[facility] INPATIENT:  Family/Significant Other Suicide Prevention Education  Suicide Prevention Education:  Education Completed; Nicki GuadalajaraHajia Awshka, Pt's wife [phone], has been identified by the patient as the family member/significant other with whom the patient will be residing, and identified as the person(s) who will aid the patient in the event of a mental health crisis (suicidal ideations/suicide attempt).  With written consent from the patient, the family member/significant other has been provided the following suicide prevention education, prior to the and/or following the discharge of the patient.  The suicide prevention education provided includes the following:  Suicide risk factors  Suicide prevention and interventions  National Suicide Hotline telephone number  [facility] telephone number  [facility] Assistance 911  [facility] and/or [facility]idential [facility] Crisis [facility] telephone number  Request made of family/significant other to:  Remove weapons (e.g., guns, rifles, knives), all items previously/currently identified as safety concern.    Remove drugs/medications (over-the-counter, prescriptions, illicit drugs), all items previously/currently identified as a safety concern.  The family member/significant other verbalizes understanding of the suicide prevention education information provided.  The family member/significant other agrees to remove the items of safety concern listed above.  Verdene LennertLauren C Alaysiah Browder 08/21/2016, 2:25 PM

## 2016-08-21 NOTE — Progress Notes (Signed)
Adult Psychoeducational Group Note  Date:  08/21/2016 Time:  1:03 AM  Group Topic/Focus:  Wrap-Up Group:   The focus of this group is to help patients review their daily goal of treatment and discuss progress on daily workbooks.  Participation Level:  Active  Participation Quality:  Appropriate  Affect:  Appropriate  Cognitive:  Appropriate  Insight: Good  Engagement in Group:  Engaged  Modes of Intervention:  Activity  Additional Comments:  Pt rated his day a 5. Pt stated he was not getting any sleep but has got more sleep since being here. Goal is to be more sociable.  Claria DiceKiara M Florella Mcneese 08/21/2016, 1:03 AM

## 2016-08-21 NOTE — Tx Team (Signed)
Interdisciplinary Treatment and Diagnostic Plan Update  08/21/2016 Time of Session: 9:30am [name] MRN: [id number]  Principal Diagnosis: MDD, no psychotic features, Schizophrenia by history  Secondary Diagnoses: Active Problems:   MDD (major depressive disorder), recurrent episode, severe (HCC)   Current Medications:  Current Facility-Administered Medications  Medication Dose Route Frequency Provider Last Rate Last Dose  . acetaminophen (TYLENOL) tablet 650 mg  650 mg Oral Q6H PRN Kerry Hough, PA-C      . alum & mag hydroxide-simeth (MAALOX/MYLANTA) 200-200-20 MG/5ML suspension 30 mL  30 mL Oral Q4H PRN Donell Sievert E, PA-C      . feeding supplement (ENSURE ENLIVE) (ENSURE ENLIVE) liquid 237 mL  237 mL Oral BID BM Simon, Spencer E, PA-C      . hydrOXYzine (ATARAX/VISTARIL) tablet 25 mg  25 mg Oral Q6H PRN Kerry Hough, PA-C      . lamoTRIgine (LAMICTAL) tablet 25 mg  25 mg Oral Daily Donell Sievert E, PA-C   25 mg at 08/21/16 2130  . risperiDONE (RISPERDAL M-TABS) disintegrating tablet 2 mg  2 mg Oral Q8H PRN Kerry Hough, PA-C       And  . LORazepam (ATIVAN) tablet 1 mg  1 mg Oral PRN Donell Sievert E, PA-C       And  . ziprasidone (GEODON) injection 20 mg  20 mg Intramuscular PRN Donell Sievert E, PA-C      . magnesium hydroxide (MILK OF MAGNESIA) suspension 30 mL  30 mL Oral Daily PRN Kerry Hough, PA-C      . QUEtiapine (SEROQUEL) tablet 50 mg  50 mg Oral BID Donell Sievert E, PA-C   50 mg at 08/21/16 8657  . traZODone (DESYREL) tablet 50 mg  50 mg Oral QHS,MR X 1 Kerry Hough, PA-C   50 mg at 08/20/16 2109    PTA Medications: Prescriptions Prior to Admission  Medication Sig Dispense Refill Last Dose  . ARIPiprazole (ABILIFY) 5 MG tablet Take 1 tablet (5 mg total) by mouth at bedtime. (Patient not taking: Reported on 08/19/2016) 30 tablet 0 Not Taking at Unknown time  . ARIPiprazole ER 400 MG SRER Inject 400 mg into the muscle every 30 (thirty)  days. Next dose due 2/18 1 each 0 Past Week at Unknown time  . benztropine (COGENTIN) 0.5 MG tablet Take 1 tablet (0.5 mg total) by mouth at bedtime. (Patient not taking: Reported on 08/19/2016) 30 tablet 0 Not Taking at Unknown time  . cyproheptadine (PERIACTIN) 4 MG tablet Take 4 mg by mouth daily as needed (appepitite stimulation).   unknown at Unknown time  . hydrOXYzine (ATARAX/VISTARIL) 25 MG tablet Take 1 tablet (25 mg total) by mouth every 6 (six) hours as needed for anxiety. (Patient not taking: Reported on 08/19/2016) 30 tablet 0 Not Taking at Unknown time  . traZODone (DESYREL) 50 MG tablet Take 1 tablet (50 mg total) by mouth at bedtime as needed for sleep. (Patient not taking: Reported on 08/19/2016) 30 tablet 0 Not Taking at Unknown time    Treatment Modalities: Medication Management, Group therapy, Case management,  1 to 1 session with clinician, Psychoeducation, Recreational therapy.  Patient Stressors: Loss of Mother passed away last monday Other: Hasn't slept in 3 nights  Patient Strengths: Capable of independent living Metallurgist fund of knowledge Motivation for treatment/growth Physical Health Supportive family/friends  Physician Treatment Plan for Primary Diagnosis: MDD, no psychotic features, Schizophrenia by history Long Term Goal(s): Improvement in symptoms so  as ready for discharge  Short Term Goals: Ability to verbalize feelings will improve Ability to disclose and discuss suicidal ideas Ability to demonstrate self-control will improve Ability to identify and develop effective coping behaviors will improve Ability to maintain clinical measurements within normal limits will improve Compliance with prescribed medications will improve Ability to identify changes in lifestyle to reduce recurrence of condition will improve Ability to maintain clinical measurements within normal limits will improve  Medication Management: Evaluate  patient's response, side effects, and tolerance of medication regimen.  Therapeutic Interventions: 1 to 1 sessions, Unit Group sessions and Medication administration.  Evaluation of Outcomes: Progressing  Physician Treatment Plan for Secondary Diagnosis: Active Problems:   MDD (major depressive disorder), recurrent episode, severe (HCC)   Long Term Goal(s): Improvement in symptoms so as ready for discharge  Short Term Goals: Ability to verbalize feelings will improve Ability to disclose and discuss suicidal ideas Ability to demonstrate self-control will improve Ability to identify and develop effective coping behaviors will improve Ability to maintain clinical measurements within normal limits will improve Compliance with prescribed medications will improve Ability to identify changes in lifestyle to reduce recurrence of condition will improve Ability to maintain clinical measurements within normal limits will improve  Medication Management: Evaluate patient's response, side effects, and tolerance of medication regimen.  Therapeutic Interventions: 1 to 1 sessions, Unit Group sessions and Medication administration.  Evaluation of Outcomes: Progressing   RN Treatment Plan for Primary Diagnosis: MDD, no psychotic features, Schizophrenia by history Long Term Goal(s): Knowledge of disease and therapeutic regimen to maintain health will improve  Short Term Goals: Ability to remain free from injury will improve, Ability to demonstrate self-control, Ability to participate in decision making will improve, Ability to disclose and discuss suicidal ideas, Ability to identify and develop effective coping behaviors will improve and Compliance with prescribed medications will improve  Medication Management: RN will administer medications as ordered by provider, will assess and evaluate patient's response and provide education to patient for prescribed medication. RN will report any adverse and/or  side effects to prescribing provider.  Therapeutic Interventions: 1 on 1 counseling sessions, Psychoeducation, Medication administration, Evaluate responses to treatment, Monitor vital signs and CBGs as ordered, Perform/monitor CIWA, COWS, AIMS and Fall Risk screenings as ordered, Perform wound care treatments as ordered.  Evaluation of Outcomes: Progressing   LCSW Treatment Plan for Primary Diagnosis: MDD, no psychotic features, Schizophrenia by history Long Term Goal(s): Safe transition to appropriate next level of care at discharge, Engage patient in therapeutic group addressing interpersonal concerns.  Short Term Goals: Engage patient in aftercare planning with referrals and resources, Identify triggers associated with mental health/substance abuse issues and Increase skills for wellness and recovery  Therapeutic Interventions: Assess for all discharge needs, 1 to 1 time with Social worker, Explore available resources and support systems, Assess for adequacy in community support network, Educate family and significant other(s) on suicide prevention, Complete Psychosocial Assessment, Interpersonal group therapy.  Evaluation of Outcomes: Progressing   Progress in Treatment: Attending groups: No Participating in groups: No Taking medication as prescribed: Yes, MD continues to assess for medication changes as needed Toleration medication: Yes, no side effects reported at this time Family/Significant other contact made: No, CSW attempting to make contact with wife Patient understands diagnosis: Continuing to assess Discussing patient identified problems/goals with staff: Yes Medical problems stabilized or resolved: Yes Denies suicidal/homicidal ideation: Yes Issues/concerns per patient self-inventory: None Other: N/A  New problem(s) identified: None identified at this time.  New Short Term/Long Term Goal(s): "better sleep and appetite as well as worrying less"  Discharge Plan or  Barriers: Pt will return home and follow-up with outpatient services at [facility]   Reason for Continuation of Hospitalization: Depression Medication stabilization  Estimated Length of Stay: 3-5 days  Attendees: Patient: Brent [name]  08/21/2016  11:35 AM  Physician: Dr. Jama Flavorsobos 08/21/2016  11:35 AM  Nursing: Vesta Mixeraroline B., RN 08/21/2016  11:35 AM  RN Care Manager: Onnie BoerJennifer Clark, RN 08/21/2016  11:35 AM  Social Worker: Vernie ShanksLauren Amador Braddy, LCSW 08/21/2016  11:35 AM  Recreational Therapist:  08/21/2016  11:35 AM  Other: Reola Calkinsravis Money, NP 08/21/2016  11:35 AM  Other:  08/21/2016  11:35 AM  Other: 08/21/2016  11:35 AM    Scribe for Treatment Team: Verdene LennertLauren C Tharon Bomar, LCSW 08/21/2016 11:35 AM

## 2016-08-21 NOTE — Progress Notes (Addendum)
D: Patient states he is sleeping better.  He denies any auditory hallucinations or thoughts of self harm today.  He is concerned about returning to work and would like a letter upon discharge.  He met with treatment team and discussed his goals.  Patient states he would "like to get back to work and hopes his appetite improves."  He states, "the food is just not good."  Offered patient an ensure and he is agreeable to take it.  Patient has flat, blunted affect; his mood is depressed.  Patient has some unresolved grief issues with his mother dying recently.  Patient continues to have poor appetite; will give ensure as a supplement.  Patient did attend a group this morning; he has a tendency to isolate to his room.  He appears withdrawn and depressed. Patient rates his depression as a 7; hopelessness as a 4; anxiety as a 5. A: Continue to monitor medication management and MD orders.  Safety checks continued every 15 minutes per protocol.  Offer support and encouragement as needed. R: Patient is receptive to staff; his behavior is appropriate.

## 2016-08-22 NOTE — Progress Notes (Signed)
D: Patient is isolative this morning.  Did not come up for his morning medications.  Patient has been in bed.  He has not been visible in the milieu.  Patient presents with flat, blunted affect.  His mood is depressed.  Patient has some unresolved grief issues with loss in his family, most recent his mother.  He denies any auditory hallucinations.  He denies any thoughts of self harm.  He is concerned about returning to work due to financial problems. A: Continue to monitor medication management and MD orders.  Safety checks completed every 15 minutes per protocol.  Offer support and encouragement as needed. R: Patient is receptive to staff; he remains isolative and withdrawn.

## 2016-08-22 NOTE — Plan of Care (Signed)
Problem: Activity: Goal: Interest or engagement in activities will improve Outcome: Not Progressing Patient remains isolative and withdrawn.  Encouraged to attend groups.

## 2016-08-22 NOTE — Progress Notes (Signed)
Patient ID: [name], male   DOB: 02/02/1980, 36 y.o.   MRN: [id number]  Pt did not attend wrap up group session.
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
## 2016-08-22 NOTE — [facility] Group Notes (Signed)
[facility] LCSW Group Therapy 08/22/2016 1:15pm  Type of Therapy: Group Therapy- [facility]  Pt did not attend, declined invitation.   Vernie ShanksLauren Korrine Sicard, LCSW 08/22/2016 3:02 PM

## 2016-08-22 NOTE — Progress Notes (Signed)
Pt reports he is doing better and says he is hopeful to go home soon.  He denies SI/HI/AVH.  He says his appetite is improving and he has been able to eat a little more today.  He also has been drinking Ensure as well.  He voiced no needs or concerns this evening.  He spent some time in the dayroom, but did go to be early before snacks were given.  Writer went to his room to ask him whether he wanted his night med.  He seemed a little confused, but then realized it was still night time.  He got up and ate a snack, took his med and then returned to bed.  Support and encouragement offered.  Discharge plans are in process.  Pt plans to return home at discharge.  Safety maintained with q15 minute checks.

## 2016-08-23 LAB — LIPID PANEL
CHOL/HDL RATIO: 3.8 ratio
Cholesterol: 150 mg/dL (ref 0–200)
HDL: 39 mg/dL — ABNORMAL LOW (ref >40)
LDL Cholesterol: 93 mg/dL (ref 0–99)
Triglycerides: 88 mg/dL (ref <150)
VLDL: 18 mg/dL (ref 0–40)

## 2016-08-23 LAB — HEMOGLOBIN A1C
HEMOGLOBIN A1C: 4.4 % — AB (ref 4.8–5.6)
MEAN PLASMA GLUCOSE: 79.58 mg/dL

## 2016-08-23 MED ORDER — LAMOTRIGINE 25 MG PO TABS: 25.0000 mg | ORAL_TABLET | Freq: Every day | ORAL | 0 refills | Status: DC

## 2016-08-23 MED ORDER — HYDROXYZINE HCL 25 MG PO TABS: 25.0000 mg | ORAL_TABLET | Freq: Four times a day (QID) | ORAL | 0 refills | Status: DC | PRN

## 2016-08-23 MED ORDER — TRAZODONE HCL 50 MG PO TABS: 50.0000 mg | ORAL_TABLET | Freq: Every evening | ORAL | 0 refills | Status: DC | PRN

## 2016-08-23 NOTE — Progress Notes (Signed)
Discharge note:  Patient discharged home per MD order.  Patient received all personal belongings from unit and locker. Patient received prescriptions of his medications.  He will follow up with [facility].  Reviewed AVS/transition record with patient and he indicated understanding.  Patient denies any thoughts of self harm.  He rates his depression and hopelessness as a 6; anxiety as a 7.  He states he is sleeping well and his appetite has increased.  He denies HI and does not appear to be responding to internal stimuli.  Patient left ambulatory with his wife and will returning to their home.

## 2016-08-23 NOTE — Progress Notes (Signed)
D: Patient got up for breakfast this morning and states, "I ate well."  Patient appears well rested.  His affect is brighter today.  Patient states, "I feel better since I've been sleeping."  Patient states he will attempt to stay up today.  Encouraged patient to go to groups today.  Patient reports decreased depressive symptoms.  He is interacting better with staff and peers.  He rates his depression and hopelessness as a 6; anxiety as a 7.  His appetite has improved and he is drinking ensures regularly.  He states he would like to stay on them after discharge.  Patient denies any auditory hallucinations or thoughts of self harm. A: Continue to monitor medication management and MD orders.  Safety checks completed every 15 minutes per protocol.  Offer support and encouragement as needed. R: Patient is receptive to staff; his behavior is appropriate.  He is less withdrawn and isolative today.
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
## 2016-08-23 NOTE — Progress Notes (Signed)
Pt has spent the whole shift in his room.  He was encouraged to come out for his night med and to get a snack which he did.  Pt reports that he is doing well and feels the medications are helping him feel relaxed and sleep better at night.  He denies SI/HI/AVH.  He wants to go home because he is afraid of losing his job.  He remains flat with a depressed affect.  Pt voiced no needs or concerns this evening.  Support and encouragement offered.  Discharge plans are in process.  Safety maintained with q15 minute checks.

## 2016-08-23 NOTE — Progress Notes (Signed)
Recreation Therapy Notes  Date: 08/23/2016 Time: 9:30am Location: [address] Dayroom  Group Topic: Stress Management  Goal Area(s) Addresses:  Patient will verbalize importance of using healthy stress management.  Patient will identify positive emotions associated with healthy stress management.   Intervention: Stress Management  Activity :  Guided Meditation. Recreation Therapy Intern introduced the stress management technique of guided meditation. Recreation Therapy Intern read a script that allowed patients to reach into their inner "chakra." Recreation Therapy Intern played calming meditation music. Patients were to follow along as script was read to engage in the activity.  Education: Stress Management, Discharge Planning.   Education Outcome: Needs additional education  Clinical Observations/Feedback: Pt did not attend group.  [place], Recreation Therapy Intern  

## 2016-08-23 NOTE — Progress Notes (Signed)
  [facility] :  Will you be returning to the same living situation after discharge:  Yes,  Pt returning home At discharge, do you have transportation home?: Yes,  Pt wife to pick up Do you have the ability to pay for your medications: Yes,  Pt provided with prescriptions  Release of information consent forms completed and in the chart;  Patient's signature needed at discharge.  Patient to Follow up at: Follow-up Information    [facility] Follow up on 09/13/2016.   Why:  at 12:15pm with Dolphus Jennyracy Hampton for your next Abilify injection Contact information: [address] [place] [address] [phone]        Pt declines therapy referral Follow up.           Next level of care provider has [facility] Link:no  Safety Planning and Suicide Prevention discussed: Yes,  with wife; see SPE note  Have you used any form of tobacco in the last 30 days? (Cigarettes, Smokeless Tobacco, Cigars, and/or Pipes): No  Has patient been referred to the Quitline?: N/A patient is not a smoker  Patient has been referred for addiction treatment: Yes  Verdene LennertLauren C Magaline Steinberg, LCSW 08/23/2016, 10:48 AM

## 2016-08-23 NOTE — Discharge Summary (Signed)
Physician Discharge Summary Note  Patient:  Brent [name] is an 36 y.o., male MRN:  [id number] DOB:  12/22/1980 Patient phone:  [phone] (home)  Patient address:   [address] [place] [address],  Total Time spent with patient: 20 minutes  Date of Admission:  08/20/2016 Date of Discharge: 08/23/16   Reason for Admission:  Increased depression w/ hx of schizophrenia  Principal Problem: MDD (major depressive disorder), recurrent episode, severe [facility](HCC) Discharge Diagnoses: Patient Active Problem List   Diagnosis Date Noted  . MDD (major depressive disorder), recurrent episode, severe (HCC) [F33.2] 08/20/2016  . Schizophrenia (HCC) [F20.9] 02/02/2016    Past Psychiatric History: Patient has a history of prior psychiatric admissions . In the past he has been diagnosed with Schizophrenia . His most recent psychiatric admission was January 2018. He denies history of suicide attempts. As above endorses history of PTSD symptoms in the past   Past Medical History:  Past Medical History:  Diagnosis Date  . Schizophrenia (HCC)    History reviewed. No pertinent surgical history. Family History:  Family History  Problem Relation Age of Onset  . Mental illness Neg Hx    Family Psychiatric  History: Denies Social History:  History  Alcohol Use No     History  Drug Use No    Social History   Social History  . Marital status: Single    Spouse name: N/A  . Number of children: N/A  . Years of education: N/A   Social History Main Topics  . Smoking status: Never Smoker  . Smokeless tobacco: Never Used  . Alcohol use No  . Drug use: No  . Sexual activity: Not Asked   Other Topics Concern  . None   Social History Narrative  . None    Hospital Course:  36 year old male. Reports his mother died about a week ago, after which he has been feeling increasingly depressed . In particular, he describes insomnia as his major symptom/complaint, and states he has been  unable to sleep much since she passed away. Attributes worsening depression to the insomnia . He states " when I can't sleep I get depressed and very anxious".  Of note, patient has been diagnosed with Schizophrenia in the past, and is on Abilify SRER 400 mgr IM Q Month. States he received last dose about 2- 3 weeks ago. He denies  , however, current psychotic symptoms and does not appear internally preoccupied , no delusions are expressed . As above, he endorses mainly depression, anxiety, and neuro-vegetative symptoms ( poor sleep is major complaint) , but also endorses poor energy level and poor appetite.  Patient improved quickly over the stay. Medications were continued and Hydroxyzine and Trazodone were added PRN to assist with anxiety and sleep. Patient attended groups and interacted with staff and patients appropriately. He agreed to continue medications and to continue his Abilify Maintena 400 mg IM injection through his outpatient provider. He denied any SI/HI/AVH. His mood was pleasant with appropriate affect.   Physical Findings: AIMS: Facial and Oral Movements Muscles of Facial Expression: None, normal Lips and Perioral Area: None, normal Jaw: None, normal Tongue: None, normal,Extremity Movements Upper (arms, wrists, hands, fingers): None, normal Lower (legs, knees, ankles, toes): None, normal, Trunk Movements Neck, shoulders, hips: None, normal, Overall Severity Severity of abnormal movements (highest score from questions above): None, normal Incapacitation due to abnormal movements: None, normal Patient's awareness of abnormal movements (rate only patient's report): No Awareness, Dental Status Current  problems with teeth and/or dentures?: No Does patient usually wear dentures?: No  CIWA:    COWS:     Musculoskeletal: Strength & Muscle Tone: within normal limits Gait & Station: normal Patient leans: N/A  Psychiatric Specialty Exam: Physical Exam  Nursing note and vitals  reviewed. Constitutional: He is oriented to person, place, and time. He appears well-developed and well-nourished.  Respiratory: Effort normal.  Musculoskeletal: Normal range of motion.  Neurological: He is alert and oriented to person, place, and time.    Review of Systems  Constitutional: Negative.   HENT: Negative.   Eyes: Negative.   Respiratory: Negative.   Cardiovascular: Negative.   Gastrointestinal: Negative.   Genitourinary: Negative.   Musculoskeletal: Negative.   Skin: Negative.   Neurological: Negative.   Endo/Heme/Allergies: Negative.     Blood pressure 114/76, pulse (!) 58, temperature 98.5 F (36.9 C), temperature source Oral, resp. rate 16, height 5\' 4"  (1.626 m), weight 63.5 kg (140 lb).Body mass index is 24.03 kg/m.  General Appearance: Casual  Eye Contact:  Good  Speech:  Clear and Coherent and Normal Rate  Volume:  Normal  Mood:  Euthymic  Affect:  Appropriate  Thought Process:  Coherent and Descriptions of Associations: Intact  Orientation:  Full (Time, Place, and Person)  Thought Content:  WDL  Suicidal Thoughts:  No  Homicidal Thoughts:  No  Memory:  Immediate;   Good Recent;   Good  Judgement:  Good  Insight:  Good  Psychomotor Activity:  Normal  Concentration:  Concentration: Good and Attention Span: Good  Recall:  Good  Fund of Knowledge:  Good  Language:  Good  Akathisia:  No  Handed:  Right  AIMS (if indicated):     Assets:  Desire for Improvement Housing Social Support Transportation  ADL's:  Intact  Cognition:  WNL  Sleep:  Number of Hours: 6.75     Have you used any form of tobacco in the last 30 days? (Cigarettes, Smokeless Tobacco, Cigars, and/or Pipes): No  Has this patient used any form of tobacco in the last 30 days? (Cigarettes, Smokeless Tobacco, Cigars, and/or Pipes) Yes, No  Blood Alcohol level:  Lab Results  Component Value Date   ETH <5 08/19/2016   ETH <5 04/05/2015    Metabolic Disorder Labs:  Lab Results   Component Value Date   HGBA1C 4.4 (L) 08/23/2016   MPG 79.58 08/23/2016   MPG 85 02/02/2016   Lab Results  Component Value Date   PROLACTIN 17.0 (H) 02/02/2016   PROLACTIN 15.3 (H) 04/05/2015   Lab Results  Component Value Date   CHOL 150 08/23/2016   TRIG 88 08/23/2016   HDL 39 (L) 08/23/2016   CHOLHDL 3.8 08/23/2016   VLDL 18 08/23/2016   LDLCALC 93 08/23/2016   LDLCALC 104 (H) 02/02/2016    See Psychiatric Specialty Exam and Suicide Risk Assessment completed by Attending Physician prior to discharge.  Discharge destination:  Home  Is patient on multiple antipsychotic therapies at discharge:  No   Has Patient had three or more failed trials of antipsychotic monotherapy by history:  No  Recommended Plan for Multiple Antipsychotic Therapies: NA   Allergies as of 08/23/2016      Reactions   Haloperidol Other (See Comments)   Patient experienced acute dystonia after receiving 10 mg dose of haldol. Listed in outside allergies from [facility]      Medication List    STOP taking these medications   benztropine 0.5 MG tablet Commonly  known as:  COGENTIN   cyproheptadine 4 MG tablet Commonly known as:  PERIACTIN     TAKE these medications     Indication  ARIPiprazole ER 400 MG Srer Inject 400 mg into the muscle every 30 (thirty) days. Next dose due 2/18 What changed:  Another medication with the same name was removed. Continue taking this medication, and follow the directions you see here.  Indication:  Mixed Bipolar Affective Disorder   hydrOXYzine 25 MG tablet Commonly known as:  ATARAX/VISTARIL Take 1 tablet (25 mg total) by mouth every 6 (six) hours as needed for anxiety.  Indication:  Feeling Anxious   lamoTRIgine 25 MG tablet Commonly known as:  LAMICTAL Take 1 tablet (25 mg total) by mouth daily.  Indication:  mood stability   traZODone 50 MG tablet Commonly known as:  DESYREL Take 1 tablet (50 mg total) by mouth at bedtime and may repeat dose  one time if needed. What changed:  when to take this  reasons to take this  Indication:  Trouble Sleeping      Follow-up Information    [facility] Follow up on 09/13/2016.   Why:  at 12:15pm with Dolphus Jenny for your next Abilify injection Contact information: [address] [place] [address] [phone]        Pt declines therapy referral Follow up.           Follow-up recommendations:  Continue activity as tolerated. Continue diet as recommended by your PCP. Ensure to keep all appointments with outpatient providers.  Comments:  Patient is instructed prior to discharge to: Take all medications as prescribed by his/her mental healthcare provider. Report any adverse effects and or reactions from the medicines to his/her outpatient provider promptly. Patient has been instructed & cautioned: To not engage in alcohol and or illegal drug use while on prescription medicines. In the event of worsening symptoms, patient is instructed to call the crisis hotline, 911 and or go to the nearest ED for appropriate evaluation and treatment of symptoms. To follow-up with his/her primary care provider for your other medical issues, concerns and or health care needs.    Signed: Gerlene Burdock Money, FNP 08/23/2016, 1:00 PM   Patient seen, Suicide Assessment Completed.  Disposition Plan Reviewed

## 2016-08-24 LAB — PROLACTIN: Prolactin: 17 ng/mL — ABNORMAL HIGH (ref 4.0–15.2)

## 2017-02-04 ENCOUNTER — Ambulatory Visit (HOSPITAL_COMMUNITY)
Admission: RE | Admit: 2017-02-04 | Discharge: 2017-02-04 | Disposition: A | Discharge status: Home / Self Care | Payer: 59 | Attending: Psychiatry | Admitting: Psychiatry

## 2017-02-04 DIAGNOSIS — R450 Nervousness: Secondary | ICD-10-CM | POA: Insufficient documentation

## 2017-02-04 DIAGNOSIS — G4700 Insomnia, unspecified: Secondary | ICD-10-CM | POA: Diagnosis present
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
## 2017-02-04 NOTE — H&P (Signed)
Behavioral Health Medical Screening Exam  Brent [name] is an 37 y.o. male who arrived to [facility] accompanied by wife and brother. Patient stated that he has not being sleeping good due to being under pressure regarding the house his wife is buying. He stated that he called his OP provider and they sent him sleeping medications but his insurance will not cover it. Patient stated that his wife wants him to come and stay her for a couple of days to rest. He denies any SI/HI/VAH.  Total Time spent with patient: 20 minutes  Psychiatric Specialty Exam: Physical Exam  Vitals reviewed. Constitutional: He is oriented to person, place, and time. He appears well-developed and well-nourished.  HENT:  Head: Normocephalic and atraumatic.  Eyes: Pupils are equal, round, and reactive to light.  Neck: Normal range of motion.  Cardiovascular: Normal rate, regular rhythm and normal heart sounds.  Respiratory: Effort normal and breath sounds normal.  GI: Soft. Bowel sounds are normal.  Musculoskeletal: Normal range of motion.  Neurological: He is alert and oriented to person, place, and time.  Skin: Skin is warm and dry.    Review of Systems  Psychiatric/Behavioral: Negative for depression, hallucinations, memory loss, substance abuse and suicidal ideas. The patient is nervous/anxious and has insomnia.   All other systems reviewed and are negative.   Blood pressure 130/75, pulse 70, temperature 98.2 F (36.8 C), temperature source Oral, resp. rate 18, SpO2 100 %.There is no height or weight on file to calculate BMI.  General Appearance: Casual  Eye Contact:  Good  Speech:  Clear and Coherent and Normal Rate  Volume:  Normal  Mood:  Euthymic  Affect:  Appropriate  Thought Process:  Coherent and Goal Directed  Orientation:  Full (Time, Place, and Person)  Thought Content:  WDL and Logical  Suicidal Thoughts:  No  Homicidal Thoughts:  No  Memory:  Immediate;   Good Recent;   Good Remote;   Fair   Judgement:  Intact  Insight:  Present  Psychomotor Activity:  Normal  Concentration: Concentration: Good and Attention Span: Good  Recall:  Good  Fund of Knowledge:Good  Language: Good  Akathisia:  Negative  Handed:  Right  AIMS (if indicated):     Assets:  Communication Skills Desire for Improvement Financial Resources/Insurance Housing Intimacy Physical Health Social Support  Sleep:       Musculoskeletal: Strength & Muscle Tone: within normal limits Gait & Station: normal Patient leans: N/A  Blood pressure 130/75, pulse 70, temperature 98.2 F (36.8 C), temperature source Oral, resp. rate 18, SpO2 100 %.  Recommendations:  Based on my evaluation the patient does not appear to have an emergency medical condition.  Delila PereyraJustina A Thomasa Heidler, NP 02/04/2017, 6:52 PM

## 2018-02-20 ENCOUNTER — Encounter (HOSPITAL_COMMUNITY): Payer: Self-pay

## 2018-02-20 ENCOUNTER — Ambulatory Visit (HOSPITAL_COMMUNITY)
Admission: EM | Admit: 2018-02-20 | Discharge: 2018-02-20 | Disposition: A | Discharge status: Home / Self Care | Payer: 59 | Attending: Family Medicine | Admitting: Family Medicine

## 2018-02-20 DIAGNOSIS — M542 Cervicalgia: Secondary | ICD-10-CM | POA: Diagnosis not present

## 2018-02-20 DIAGNOSIS — M25512 Pain in left shoulder: Secondary | ICD-10-CM | POA: Diagnosis not present

## 2018-02-20 DIAGNOSIS — M79622 Pain in left upper arm: Secondary | ICD-10-CM | POA: Diagnosis not present

## 2018-02-20 HISTORY — DX: Sleep disorder, unspecified: G47.9

## 2018-02-20 HISTORY — DX: Post-traumatic stress disorder, unspecified: F43.10

## 2018-02-20 MED ORDER — TIZANIDINE HCL 4 MG PO TABS: 4.0000 mg | ORAL_TABLET | Freq: Four times a day (QID) | ORAL | 0 refills | Status: DC | PRN

## 2018-02-20 MED ORDER — IBUPROFEN 800 MG PO TABS: 800.0000 mg | ORAL_TABLET | Freq: Three times a day (TID) | ORAL | 0 refills | Status: DC

## 2018-02-20 MED ORDER — KETOROLAC TROMETHAMINE 60 MG/2ML IM SOLN
INTRAMUSCULAR | Status: AC
  Filled 2018-02-20: qty 2

## 2018-02-20 MED ORDER — KETOROLAC TROMETHAMINE 60 MG/2ML IM SOLN
60.0000 mg | Freq: Once | INTRAMUSCULAR | Status: AC
  Administered 2018-02-20: 60 mg via INTRAMUSCULAR

## 2018-02-20 NOTE — ED Triage Notes (Signed)
Pt presents with neck & shoulder pain on the left side not associated with any injury; pt believes he may have slept the wrong way but pain is progressively getting worse.

## 2018-02-20 NOTE — ED Provider Notes (Signed)
[facility]    CSN: [id number] Arrival date & time: 02/20/18  0630     History   Chief Complaint Chief Complaint  Patient presents with  . Shoulder Pain  . Neck Pain    HPI Brent [name] is a 38 y.o. male.   HPI  Patient is here for acute left neck pain.  He states it started on Wednesday.  He woke up with it.  No change in activity.  No fall.  No injury.  He is never had this before.  The pain is in his left neck, down the upper part of the shoulder blade.  He has some pain radiating into his upper arm.  No numbness or weakness.  No known neck problems. He has mental health issues.  Compliant with his medical care and medications.  Past Medical History:  Diagnosis Date  . PTSD (post-traumatic stress disorder)   . Schizophrenia (HCC)   . Sleep disorder, unspecified     Patient Active Problem List   Diagnosis Date Noted  . MDD (major depressive disorder), recurrent episode, severe (HCC) 08/20/2016  . Schizophrenia (HCC) 02/02/2016    History reviewed. No pertinent surgical history.     Home Medications    Prior to Admission medications   Medication Sig Start Date End Date Taking? Authorizing Provider  ARIPiprazole ER 400 MG SRER Inject 400 mg into the muscle every 30 (thirty) days. Next dose due 2/18 03/03/16   Adonis Brook, NP  hydrOXYzine (ATARAX/VISTARIL) 25 MG tablet Take 1 tablet (25 mg total) by mouth every 6 (six) hours as needed for anxiety. 08/23/16   Money, Gerlene Burdock, FNP  ibuprofen (ADVIL,MOTRIN) 800 MG tablet Take 1 tablet (800 mg total) by mouth 3 (three) times daily. 02/20/18   Eustace Moore, MD  lamoTRIgine (LAMICTAL) 25 MG tablet Take 1 tablet (25 mg total) by mouth daily. 08/24/16   Money, Gerlene Burdock, FNP  tiZANidine (ZANAFLEX) 4 MG tablet Take 1-2 tablets (4-8 mg total) by mouth every 6 (six) hours as needed for muscle spasms. 02/20/18   Eustace Moore, MD  traZODone (DESYREL) 50 MG tablet Take 1 tablet (50 mg total) by mouth at  bedtime and may repeat dose one time if needed. 08/23/16   Money, Gerlene Burdock, FNP    Family History Family History  Problem Relation Age of Onset  . Mental illness Neg Hx     Social History Social History   Tobacco Use  . Smoking status: Never Smoker  . Smokeless tobacco: Never Used  Substance Use Topics  . Alcohol use: No  . Drug use: No     Allergies   Haloperidol and Pork-derived products   Review of Systems Review of Systems  Constitutional: Negative for chills and fever.  HENT: Negative for ear pain and sore throat.   Eyes: Negative for pain and visual disturbance.  Respiratory: Negative for cough and shortness of breath.   Cardiovascular: Negative for chest pain and palpitations.  Gastrointestinal: Negative for abdominal pain and vomiting.  Genitourinary: Negative for dysuria and hematuria.  Musculoskeletal: Positive for neck pain and neck stiffness. Negative for arthralgias and back pain.  Skin: Negative for color change and rash.  Neurological: Negative for seizures, syncope, weakness and numbness.  All other systems reviewed and are negative.    Physical Exam Triage Vital Signs ED Triage Vitals  Enc Vitals Group     BP 02/20/18 0922 (!) 147/91     Pulse Rate 02/20/18 0922 63  Resp 02/20/18 0922 (!) 22     Temp 02/20/18 0922 98 F (36.7 C)     Temp Source 02/20/18 0922 Oral     SpO2 02/20/18 0922 100 %     Weight --      Height --      Head Circumference --      Peak Flow --      Pain Score 02/20/18 0923 10     Pain Loc --      Pain Edu? --      Excl. in GC? --    No data found.  Updated Vital Signs BP (!) 147/91 (BP Location: Left Arm)   Pulse 63   Temp 98 F (36.7 C) (Oral)   Resp (!) 22   SpO2 100%      Physical Exam Constitutional:      General: He is in acute distress.     Appearance: He is well-developed and normal weight.     Comments: Appears acutely uncomfortable.  Is pacing the room  HENT:     Head: Normocephalic and  atraumatic.     Nose: Nose normal. No congestion.     Mouth/Throat:     Mouth: Mucous membranes are moist.  Eyes:     Conjunctiva/sclera: Conjunctivae normal.     Pupils: Pupils are equal, round, and reactive to light.  Neck:     Musculoskeletal: Normal range of motion.  Cardiovascular:     Rate and Rhythm: Normal rate and regular rhythm.     Heart sounds: Normal heart sounds.  Pulmonary:     Effort: Pulmonary effort is normal. No respiratory distress.     Breath sounds: Normal breath sounds.  Abdominal:     General: There is no distension.     Palpations: Abdomen is soft.  Musculoskeletal: Normal range of motion.     Comments: -Palpation in the left upper body trapezius, lower neck region.  No palpable spasm.  Limited range of motion.  Strength sensation range of motion reflexes are normal in both upper extremities  Skin:    General: Skin is warm and dry.  Neurological:     General: No focal deficit present.     Mental Status: He is alert.     Sensory: No sensory deficit.     Coordination: Coordination normal.     Deep Tendon Reflexes: Reflexes normal.      UC Treatments / Results  Labs (all labs ordered are listed, but only abnormal results are displayed) Labs Reviewed - No data to display  EKG None  Radiology No results found.  Procedures Procedures (including critical care time)  Medications Ordered in UC Medications  ketorolac (TORADOL) injection 60 mg (60 mg Intramuscular Given 02/20/18 1017)    Initial Impression / Assessment and Plan / UC Course  I have reviewed the triage vital signs and the nursing notes.  Pertinent labs & imaging results that were available during my care of the patient were reviewed by me and considered in my medical decision making (see chart for details).     Patient is given a shot of Toradol.  Observed for 30 minutes. Final Clinical Impressions(s) / UC Diagnoses   Final diagnoses:  Neck pain     Discharge Instructions       Continue ice to area May alternate with heat Take the ibuprofen 3 x a day with food Take the tizanidine as needed muscle relaxer Should improve in a few days     ED  Prescriptions    Medication Sig Dispense Auth. Provider   ibuprofen (ADVIL,MOTRIN) 800 MG tablet Take 1 tablet (800 mg total) by mouth 3 (three) times daily. 21 tablet Eustace Moore, MD   tiZANidine (ZANAFLEX) 4 MG tablet Take 1-2 tablets (4-8 mg total) by mouth every 6 (six) hours as needed for muscle spasms. 21 tablet Eustace Moore, MD     Controlled Substance Prescriptions [place] Controlled Substance Registry consulted? Not Applicable   Eustace Moore, MD 02/20/18 2012

## 2018-02-20 NOTE — Discharge Instructions (Signed)
Continue ice to area May alternate with heat Take the ibuprofen 3 x a day with food Take the tizanidine as needed muscle relaxer Should improve in a few days

## 2018-03-11 ENCOUNTER — Ambulatory Visit (HOSPITAL_COMMUNITY)
Admission: EM | Admit: 2018-03-11 | Discharge: 2018-03-11 | Disposition: A | Discharge status: Home / Self Care | Payer: 59

## 2018-03-11 ENCOUNTER — Encounter (HOSPITAL_COMMUNITY): Payer: Self-pay

## 2018-03-11 DIAGNOSIS — M25512 Pain in left shoulder: Secondary | ICD-10-CM

## 2018-03-11 NOTE — ED Provider Notes (Signed)
[facility]    CSN: [id number] Arrival date & time: 03/11/18  0802     History   Chief Complaint Chief Complaint  Patient presents with  . Shoulder Pain    HPI Brent [name] is a 38 y.o. male.   She has a 38 year old male that presents with left shoulder, left upper back pain.  This is been an ongoing problem over the last couple weeks.  He was seen here approximate 2 weeks ago and treated with ibuprofen and muscle relaxant which he reports works.  His symptoms have somewhat improved.  At his job he does a lot of heavy lifting and reports that this keeps exacerbating his symptoms.  He is here requesting a work note for light duty for the next week.       Past Medical History:  Diagnosis Date  . PTSD (post-traumatic stress disorder)   . Schizophrenia (HCC)   . Sleep disorder, unspecified     Patient Active Problem List   Diagnosis Date Noted  . MDD (major depressive disorder), recurrent episode, severe (HCC) 08/20/2016  . Schizophrenia (HCC) 02/02/2016    History reviewed. No pertinent surgical history.     Home Medications    Prior to Admission medications   Medication Sig Start Date End Date Taking? Authorizing Provider  ARIPiprazole ER 400 MG SRER Inject 400 mg into the muscle every 30 (thirty) days. Next dose due 2/18 03/03/16   Adonis Brook, NP  hydrOXYzine (ATARAX/VISTARIL) 25 MG tablet Take 1 tablet (25 mg total) by mouth every 6 (six) hours as needed for anxiety. 08/23/16   Money, Gerlene Burdock, FNP  ibuprofen (ADVIL,MOTRIN) 800 MG tablet Take 1 tablet (800 mg total) by mouth 3 (three) times daily. 02/20/18   Eustace Moore, MD  lamoTRIgine (LAMICTAL) 25 MG tablet Take 1 tablet (25 mg total) by mouth daily. 08/24/16   Money, Gerlene Burdock, FNP  tiZANidine (ZANAFLEX) 4 MG tablet Take 1-2 tablets (4-8 mg total) by mouth every 6 (six) hours as needed for muscle spasms. 02/20/18   Eustace Moore, MD  traZODone (DESYREL) 50 MG tablet Take 1 tablet (50 mg  total) by mouth at bedtime and may repeat dose one time if needed. 08/23/16   Money, Gerlene Burdock, FNP    Family History Family History  Problem Relation Age of Onset  . Mental illness Neg Hx     Social History Social History   Tobacco Use  . Smoking status: Never Smoker  . Smokeless tobacco: Never Used  Substance Use Topics  . Alcohol use: No  . Drug use: No     Allergies   Haloperidol and Pork-derived products   Review of Systems Review of Systems  Constitutional: Negative for activity change, chills and fever.  Musculoskeletal: Positive for arthralgias and myalgias. Negative for joint swelling.  Skin: Negative for color change, pallor, rash and wound.     Physical Exam Triage Vital Signs ED Triage Vitals [03/11/18 0818]  Enc Vitals Group     BP 117/75     Pulse Rate 66     Resp 20     Temp 98.1 F (36.7 C)     Temp Source Oral     SpO2 99 %     Weight      Height      Head Circumference      Peak Flow      Pain Score 5     Pain Loc      Pain  Edu?      Excl. in GC?    No data found.  Updated Vital Signs BP 117/75 (BP Location: Left Arm)   Pulse 66   Temp 98.1 F (36.7 C) (Oral)   Resp 20   SpO2 99%   Visual Acuity Right Eye Distance:   Left Eye Distance:   Bilateral Distance:    Right Eye Near:   Left Eye Near:    Bilateral Near:     Physical Exam Vitals signs and nursing note reviewed.  Constitutional:      Appearance: Normal appearance. He is well-developed.  HENT:     Head: Normocephalic and atraumatic.  Eyes:     Conjunctiva/sclera: Conjunctivae normal.  Neck:     Musculoskeletal: Normal range of motion.  Pulmonary:     Effort: Pulmonary effort is normal.  Musculoskeletal: Normal range of motion.        General: No swelling, tenderness, deformity or signs of injury.  Skin:    General: Skin is warm and dry.  Neurological:     Mental Status: He is alert.  Psychiatric:        Mood and Affect: Mood normal.      UC Treatments  / Results  Labs (all labs ordered are listed, but only abnormal results are displayed) Labs Reviewed - No data to display  EKG None  Radiology No results found.  Procedures Procedures (including critical care time)  Medications Ordered in UC Medications - No data to display  Initial Impression / Assessment and Plan / UC Course  I have reviewed the triage vital signs and the nursing notes.  Pertinent labs & imaging results that were available during my care of the patient were reviewed by me and considered in my medical decision making (see chart for details).     We will give patient a work note for light duty for 1 week based on request Instructed if his symptoms continue he will need to follow-up with orthopedic for further evaluation management Final Clinical Impressions(s) / UC Diagnoses   Final diagnoses:  Pain in joint of left shoulder     Discharge Instructions     Keep using the medication that was prescribed for pain and inflammation I am giving you a note for 1 week of no heavy lifting at your job If your symptoms continue or worsen you will need to follow-up with orthopedic    ED Prescriptions    None     Controlled Substance Prescriptions  Controlled Substance Registry consulted? Not Applicable   Janace Aris, NP 03/11/18 [phone]

## 2018-03-11 NOTE — Discharge Instructions (Signed)
Keep using the medication that was prescribed for pain and inflammation I am giving you a note for 1 week of no heavy lifting at your job If your symptoms continue or worsen you will need to follow-up with orthopedic

## 2018-03-11 NOTE — ED Triage Notes (Signed)
Pt presents with continued left shoulder pain; pt states he has more relief when he is not lifting or using the shoulder so much and with prescribed medication.

## 2018-03-13 ENCOUNTER — Ambulatory Visit (INDEPENDENT_AMBULATORY_CARE_PROVIDER_SITE_OTHER): Payer: Self-pay

## 2018-03-13 ENCOUNTER — Ambulatory Visit (INDEPENDENT_AMBULATORY_CARE_PROVIDER_SITE_OTHER): Payer: Self-pay | Admitting: Family Medicine

## 2018-03-13 DIAGNOSIS — M25512 Pain in left shoulder: Secondary | ICD-10-CM

## 2018-03-13 MED ORDER — VITAMIN D-3 125 MCG (5000 UT) PO TABS: 1.0000 | ORAL_TABLET | Freq: Every day | ORAL | 3 refills | Status: DC
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
## 2018-03-13 NOTE — Progress Notes (Signed)
I saw and examined the patient with Dr. Jamse Mead and agree with assessment and plan as outlined.  Exam consistent with myofascial pain.  Cannot completely rule out cervical disc protrusion, although neuro exam non-focal.  Will keep oow for a week and start PT.

## 2019-01-30 ENCOUNTER — Ambulatory Visit (HOSPITAL_COMMUNITY)
Admission: RE | Admit: 2019-01-30 | Discharge: 2019-01-30 | Disposition: A | Discharge status: Home / Self Care | Payer: 59 | Attending: Psychiatry | Admitting: Psychiatry

## 2019-01-30 DIAGNOSIS — F209 Schizophrenia, unspecified: Secondary | ICD-10-CM | POA: Insufficient documentation

## 2019-01-30 DIAGNOSIS — G479 Sleep disorder, unspecified: Secondary | ICD-10-CM | POA: Insufficient documentation

## 2019-01-30 DIAGNOSIS — Z1389 Encounter for screening for other disorder: Secondary | ICD-10-CM | POA: Insufficient documentation

## 2019-01-30 NOTE — H&P (Signed)
Behavioral Health Medical Screening Exam  Brent [name] is an 39 y.o. male.Persent to [facility] with wife and bother who has concerns that patient's " schizophrenia symptoms are acting up."  Cited patient has been off his medication since the pandemic due to insurance.  Patient is requesting a long-acting injectable of his Abilify.  Reports restless nights.  He denies suicidal or homicidal ideations.  Denies auditory or visual hallucinations. Brent [name] reported " it is taken me a while to get back to sleep."  Patient was offered outpatient resources to assist with medications.  Support, encouragement and reassurance was provided.  Total Time spent with patient: 15 minutes  Psychiatric Specialty Exam: Physical Exam  Constitutional: He appears well-developed.  Psychiatric: He has a normal mood and affect. His behavior is normal.    Review of Systems  Psychiatric/Behavioral: Positive for sleep disturbance. Negative for hallucinations. The patient is nervous/anxious.   All other systems reviewed and are negative.   There were no vitals taken for this visit.There is no height or weight on file to calculate BMI.  General Appearance: Casual  Eye Contact:  Good  Speech:  Clear and Coherent  Volume:  Normal  Mood:  Anxious  Affect:  Congruent  Thought Process:  Coherent  Orientation:  Full (Time, Place, and Person)  Thought Content:  Logical  Suicidal Thoughts:  No  Homicidal Thoughts:  No  Memory:  Immediate;   Fair Recent;   Fair  Judgement:  Fair  Insight:  Fair  Psychomotor Activity:  Normal  Concentration: Concentration: Fair  Recall:  [employer] of Knowledge:Fair  Language: Good  Akathisia:  No  Handed:  Right  AIMS (if indicated):     Assets:  Communication Skills Desire for Improvement Resilience Social Support  Sleep:       Musculoskeletal: Strength & Muscle Tone: within normal limits Gait & Station: normal Patient leans: N/A  There were no vitals taken for this  visit.  Recommendations: Patient was provided with additional outpatient resources Encouraged to keep all follow-up appointments Based on my evaluation the patient does not appear to have an emergency medical condition.  Brent Rack, NP 01/30/2019, 6:16 PM

## 2019-01-30 NOTE — BH Assessment (Signed)
Assessment Note  Brent [name] is an 39 y.o. male who presented to [facility] as a walk-in with his wife (who was present during the assessment)  seeking an injection of Abilify.  Patient states that he has not been on his medication in almost a year because of COVID and not having health insurance.  Patient states that every time he comes to the hospital that he gets an injection and he came here to [facility] tonight expecting to get a shot.  Patient denies SI/HI/Psychosis.  He denies any drug or alcohol use.  Patient's main concern is that he has not been sleeping for the past couple of nights and states that his anxiety level has been high.  Otherwise, patient has not psychiatric complaints.  Patient denies a history of abuse or self-mutilation.  He states that he has experienced weight loss of fifteen pounds due to a decrease in appetite.    Patient presents as alert and oriented, his mood is somewhat depressed and he is moderately anxious.  He does not appear to be responding to any internal stimuli.  His thoughts are organized and his memory intact.  His judgment, insight and impulse control are partially impaired, but he appears to have capacity to make realistic decisions for himself.  His eye contact is good and his speech coherent.  Diagnosis: F31.30 Bipolar Disorder Depressed  Past Medical History:  Past Medical History:  Diagnosis Date  . PTSD (post-traumatic stress disorder)   . Schizophrenia (HCC)   . Sleep disorder, unspecified     No past surgical history on file.  Family History:  Family History  Problem Relation Age of Onset  . Mental illness Neg Hx     Social History:  reports that he has never smoked. He has never used smokeless tobacco. He reports that he does not drink alcohol or use drugs.  Additional Social History:  Alcohol / Drug Use Pain Medications: See MAR Prescriptions: See MAR Over the Counter: See MAR History of alcohol / drug use?: No history of alcohol / drug  abuse Longest period of sobriety (when/how long): n/a  CIWA:   COWS:    Allergies:  Allergies  Allergen Reactions  . Haloperidol Other (See Comments)    Patient experienced acute dystonia after receiving 10 mg dose of haldol. Listed in outside allergies from [facility] hospital  . Pork-Derived Products     Home Medications: (Not in a hospital admission)   OB/GYN Status:  No LMP for male patient.  General Assessment Data Location of Assessment: [facility] Assessment Services TTS Assessment: In system Is this a Tele or Face-to-Face Assessment?: Face-to-Face Is this an Initial Assessment or a Re-assessment for this encounter?: Initial Assessment Patient Accompanied by:: Other(adult) Language Other than English: No Living Arrangements: Other (Comment)(lives with wife) What gender do you identify as?: Male Marital status: Married Living Arrangements: Spouse/significant other Can pt return to current living arrangement?: Yes Admission Status: Voluntary Is patient capable of signing voluntary admission?: No Referral Source: Self/Family/Friend Insurance type: self-pay  Medical Screening Exam [facility] Walk-in ONLY) Medical Exam completed: Yes  Crisis Care Plan Living Arrangements: Spouse/significant other Legal Guardian: Other:(other) Name of Psychiatrist: none Name of Therapist: none  Education Status Is patient currently in school?: No Is the patient employed, unemployed or receiving disability?: Employed  Risk to self with the past 6 months Suicidal Ideation: No Has patient been a risk to self within the past 6 months prior to admission? : No Suicidal Intent: No Has patient had  any suicidal intent within the past 6 months prior to admission? : No Is patient at risk for suicide?: No Suicidal Plan?: No Has patient had any suicidal plan within the past 6 months prior to admission? : No Access to Means: No What has been your use of drugs/alcohol within the last 12 months?:  none Previous Attempts/Gestures: No How many times?: (0) Other Self Harm Risks: (none) Triggers for Past Attempts: None known Intentional Self Injurious Behavior: None Family Suicide History: No Recent stressful life event(s): Other (Comment)(not sleeping) Persecutory voices/beliefs?: No Depression: Yes Depression Symptoms: Insomnia Substance abuse history and/or treatment for substance abuse?: No Suicide prevention information given to non-admitted patients: Not applicable  Risk to Others within the past 6 months Homicidal Ideation: No Does patient have any lifetime risk of violence toward others beyond the six months prior to admission? : No Thoughts of Harm to Others: No Current Homicidal Intent: No Current Homicidal Plan: No Access to Homicidal Means: No Identified Victim: none History of harm to others?: No Assessment of Violence: None Noted Violent Behavior Description: none Does patient have access to weapons?: No Criminal Charges Pending?: No Does patient have a court date: No Is patient on probation?: No  Psychosis Hallucinations: None noted Delusions: None noted  Mental Status Report Appearance/Hygiene: Unremarkable Eye Contact: Good Motor Activity: Freedom of movement Speech: Logical/coherent Level of Consciousness: Alert Mood: Anxious Affect: Anxious Anxiety Level: Moderate Thought Processes: Coherent, Relevant Judgement: Partial Orientation: Person, Place, Time, Situation Obsessive Compulsive Thoughts/Behaviors: None  Cognitive Functioning Concentration: Normal Memory: Recent Intact, Remote Intact Is patient IDD: No Insight: Good Impulse Control: Fair Appetite: Good Have you had any weight changes? : Loss Amount of the weight change? (lbs): 15 lbs Sleep: Decreased Total Hours of Sleep: (no sleep in two days) Vegetative Symptoms: None  ADLScreening [facility] Assessment Services) Patient's cognitive ability adequate to safely complete daily  activities?: Yes Patient able to express need for assistance with ADLs?: Yes Independently performs ADLs?: Yes (appropriate for developmental age)  Prior Inpatient Therapy Prior Inpatient Therapy: Yes Prior Therapy Dates: [facility] Prior Therapy Facilty/Provider(s): 01/2016 Reason for Treatment: bipolar disorder  Prior Outpatient Therapy Prior Outpatient Therapy: No Does patient have an ACCT team?: No Does patient have Intensive In-House Services?  : No Does patient have [facility] services? : No Does patient have P4CC services?: No  ADL Screening (condition at time of admission) Patient's cognitive ability adequate to safely complete daily activities?: Yes Is the patient deaf or have difficulty hearing?: No Does the patient have difficulty seeing, even when wearing glasses/contacts?: No Does the patient have difficulty concentrating, remembering, or making decisions?: No Patient able to express need for assistance with ADLs?: Yes Does the patient have difficulty dressing or bathing?: No Independently performs ADLs?: Yes (appropriate for developmental age) Does the patient have difficulty walking or climbing stairs?: No Weakness of Legs: None Weakness of Arms/Hands: None  Home Assistive Devices/Equipment Home Assistive Devices/Equipment: None  Therapy Consults (therapy consults require a physician order) PT Evaluation Needed: No OT Evalulation Needed: No SLP Evaluation Needed: No Abuse/Neglect Assessment (Assessment to be complete while patient is alone) Abuse/Neglect Assessment Can Be Completed: Yes Physical Abuse: Denies Verbal Abuse: Denies Sexual Abuse: Denies Exploitation of patient/patient's resources: Denies Self-Neglect: Denies Values / Beliefs Cultural Requests During Hospitalization: None Spiritual Requests During Hospitalization: None Consults Spiritual Care Consult Needed: No Transition of Care Team Consult Needed: No Advance Directives (For Healthcare) Does  Patient Have a Medical Advance Directive?: No Would patient like information on  creating a medical advance directive?: No - Patient declined Nutrition Screen- [facility] Adult/[facility]/AP Has the patient recently lost weight without trying?: Yes, 2-13 lbs. Has the patient been eating poorly because of a decreased appetite?: Yes Malnutrition Screening Tool Score: 2        Disposition: Per Hillery Jacks, NP, patient does not meet inpatient criteria and can follow-up at [facility] for OP Services. Disposition Initial Assessment Completed for this Encounter: Yes Disposition of Patient: Discharge Patient refused recommended treatment: No Mode of transportation if patient is discharged/movement?: Car Patient referred to: [facility])  On Site Evaluation by:   Reviewed with Physician:    Arnoldo Lenis Nitesh Pitstick 01/30/2019 6:41 PM

## 2020-01-23 ENCOUNTER — Other Ambulatory Visit: Payer: Self-pay

## 2020-01-23 DIAGNOSIS — Z20822 Contact with and (suspected) exposure to covid-19: Secondary | ICD-10-CM

## 2020-01-26 LAB — NOVEL CORONAVIRUS, NAA: SARS-CoV-2, NAA: DETECTED — AB

## 2022-01-06 ENCOUNTER — Ambulatory Visit (HOSPITAL_COMMUNITY)
Admission: EM | Admit: 2022-01-06 | Discharge: 2022-01-06 | Disposition: A | Discharge status: Home / Self Care | Payer: BC Managed Care – PPO | Attending: Urology | Admitting: Urology

## 2022-01-06 DIAGNOSIS — F200 Paranoid schizophrenia: Secondary | ICD-10-CM

## 2022-01-06 MED ORDER — DIPHENHYDRAMINE HCL 25 MG PO CAPS
25.0000 mg | ORAL_CAPSULE | Freq: Once | ORAL | Status: AC
  Administered 2022-01-06: 25 mg via ORAL
  Filled 2022-01-06: qty 1

## 2022-01-06 MED ORDER — ARIPIPRAZOLE 5 MG PO TABS: 5.0000 mg | ORAL_TABLET | Freq: Every day | ORAL | 0 refills | Status: DC

## 2022-01-06 MED ORDER — BENZTROPINE MESYLATE 1 MG PO TABS: 0.5000 mg | ORAL_TABLET | Freq: Every day | ORAL | 0 refills | Status: DC

## 2022-01-06 MED ORDER — TRAZODONE HCL 50 MG PO TABS: 50.0000 mg | ORAL_TABLET | Freq: Every evening | ORAL | 0 refills | Status: DC | PRN

## 2022-01-06 MED ORDER — TRAZODONE HCL 50 MG PO TABS: 50.0000 mg | ORAL_TABLET | Freq: Once | ORAL | Status: DC

## 2022-01-06 MED ORDER — ARIPIPRAZOLE 10 MG PO TABS
10.0000 mg | ORAL_TABLET | Freq: Once | ORAL | Status: AC
  Administered 2022-01-06: 10 mg via ORAL
  Filled 2022-01-06: qty 1

## 2022-01-06 NOTE — ED Notes (Signed)
Discharge instructions and prescriptions given to spouse who verbalizes understanding.  [facility] on [address] called to see if they were open - they are.  Patient and spouse walked out to the lobby with no sxs of distress noted.

## 2022-01-06 NOTE — ED Notes (Signed)
Pt and wife was given d/c instructions and three prescriptions. Pt was also given a dose of Abilify and benadryl before leaving and wife was his driver home. Pt had no belongings

## 2022-01-06 NOTE — ED Provider Notes (Signed)
[facility]al [facility] Urgent Care Medical Screening Exam  Patient Name: Brent [name] MRN: [id number] Date of Evaluation: 01/06/22 Chief Complaint:   Diagnosis:  Final diagnoses:  Paranoid schizophrenia, chronic condition (HCC)    History of Present illness: Brent [name] is a 41 y.o. male. ***    Psychiatric Specialty Exam  Presentation  General Appearance:Appropriate for Environment  Eye Contact:Good  Speech:Slow; Clear and Coherent  Speech Volume:Normal  Handedness:Right   Mood and Affect  Mood: Anxious  Affect: Congruent   Thought Process  Thought Processes: Coherent  Descriptions of Associations:Intact  Orientation:Full (Time, Place and Person)  Thought Content:WDL    Hallucinations:None  Ideas of Reference:Paranoia  Suicidal Thoughts:No  Homicidal Thoughts:No   Sensorium  Memory: Immediate Good; Recent Good; Remote Good  Judgment: Fair  Insight: Fair   Chartered certified accountant: Fair  Attention Span: Fair  Recall: [employer] of Knowledge: Fair  Language: Fair   Psychomotor Activity  Psychomotor Activity: Normal   Assets  Assets: Desire for Improvement; Physical Health; Social Support; Transportation   Sleep  Sleep: Poor  Number of hours:  0 (pt report no sleep in 3 days)   No data recorded  Physical Exam: Physical Exam ROS Blood pressure (!) 159/83, pulse 100, temperature 98.8 F (37.1 C), temperature source Oral, resp. rate 16, SpO2 99 %. There is no height or weight on file to calculate BMI.  Musculoskeletal: Strength & Muscle Tone: {desc; muscle tone:32375} Gait & Station: {PE GAIT ED NATL:22525} Patient leans: {Patient Leans:[id number]}   [facility] MSE Discharge Disposition for Follow up and Recommendations: {[facility] MSE Recommendations:24277}   Maricela Bo, NP 01/06/2022, 9:53 PM

## 2022-01-06 NOTE — ED Notes (Signed)
Pt received another set of vitals due to first set being high.

## 2022-01-06 NOTE — Progress Notes (Signed)
Zbuu Ahmed Malkiewicz  ROUTINE: is a 41 year old male who presents today at [facility] voluntarily as a walk in brought in by his wife and other family members due to concerns of his lack of sleep and paranoia. The patient has not been eating and has not been taking his injections for his schizophrenia in a couple of months. Patient's wife reported that the client has not indicated having any SI/HI/ of AVH at the time of triage. Patient has a diagnosis of schizophrenia. Patient's wife denied that patient has any SA. Patient did not engage during triage. but appeared to be suspicious and frightened.   01/06/22 2020  [facility] Triage Screening (Walk-ins at [facility] only)  How Did You Hear About [place]? Family/Friend  What Is the Reason for Your Visit/Call Today? Zbuu Ahmed Ruddock is a 41 year old male who presents today at [facility] voluntarily as a walk in brought in by hi wife and other family members due to concerns of his lack of sleep and paranoia. The client has not been eating and has not been taking his injections for his schizophrenia in a couple of months. Patient's wife reported that the client has not indicated having any SI/HI/ of AVH at the time of triage. Client did not engage during triage. but appeared to be suspicious and frightened.  How Long Has This Been Causing You Problems? 1-6 months  Have You Recently Had Any Thoughts About Hurting Yourself? No  Are You Planning to Commit Suicide/Harm Yourself At This time? No  Have you Recently Had Thoughts About Hurting Someone Karolee Ohs? No  Are You Planning To Harm Someone At This Time? No  Are you currently experiencing any auditory, visual or other hallucinations? No  Have You Used Any Alcohol or Drugs in the Past 24 Hours? No  Do you have any current medical co-morbidities that require immediate attention? No  Clinician description of patient physical appearance/behavior: Client appeared to be suspicious and frightened,  What Do You Feel Would Help You the Most Today?  Treatment for Depression or other mood problem  If access to [facility] was not available, would you have sought care in the Emergency Department? No  Determination of Need Routine (7 days)  Options For Referral Medication Management;Outpatient Therapy;BH Urgent Care

## 2022-01-06 NOTE — Discharge Instructions (Addendum)

## 2022-01-07 ENCOUNTER — Ambulatory Visit (HOSPITAL_COMMUNITY)
Admission: EM | Admit: 2022-01-07 | Discharge: 2022-01-08 | Disposition: A | Discharge status: Other Acute Inpatient Hospital | Payer: BC Managed Care – PPO | Attending: Psychiatry | Admitting: Psychiatry

## 2022-01-07 DIAGNOSIS — Z79899 Other long term (current) drug therapy: Secondary | ICD-10-CM | POA: Insufficient documentation

## 2022-01-07 DIAGNOSIS — Z1152 Encounter for screening for COVID-19: Secondary | ICD-10-CM | POA: Insufficient documentation

## 2022-01-07 DIAGNOSIS — F209 Schizophrenia, unspecified: Secondary | ICD-10-CM | POA: Insufficient documentation

## 2022-01-07 DIAGNOSIS — R519 Headache, unspecified: Secondary | ICD-10-CM | POA: Diagnosis not present

## 2022-01-07 DIAGNOSIS — R410 Disorientation, unspecified: Secondary | ICD-10-CM | POA: Diagnosis not present

## 2022-01-07 DIAGNOSIS — R630 Anorexia: Secondary | ICD-10-CM | POA: Insufficient documentation

## 2022-01-07 DIAGNOSIS — R5383 Other fatigue: Secondary | ICD-10-CM | POA: Diagnosis not present

## 2022-01-07 LAB — CBC WITH DIFFERENTIAL/PLATELET
Abs Immature Granulocytes: 0.04 10*3/uL (ref 0.00–0.07)
Basophils Absolute: 0 10*3/uL (ref 0.0–0.1)
Basophils Relative: 0 %
Eosinophils Absolute: 0 10*3/uL (ref 0.0–0.5)
Eosinophils Relative: 0 %
HCT: 38.8 % — ABNORMAL LOW (ref 39.0–52.0)
Hemoglobin: 13.1 g/dL (ref 13.0–17.0)
Immature Granulocytes: 0 %
Lymphocytes Relative: 18 %
Lymphs Abs: 1.9 10*3/uL (ref 0.7–4.0)
MCH: 30.5 pg (ref 26.0–34.0)
MCHC: 33.8 g/dL (ref 30.0–36.0)
MCV: 90.2 fL (ref 80.0–100.0)
Monocytes Absolute: 0.6 10*3/uL (ref 0.1–1.0)
Monocytes Relative: 6 %
Neutro Abs: 8.1 10*3/uL — ABNORMAL HIGH (ref 1.7–7.7)
Neutrophils Relative %: 76 %
Platelets: 335 10*3/uL (ref 150–400)
RBC: 4.3 MIL/uL (ref 4.22–5.81)
RDW: 12.2 % (ref 11.5–15.5)
WBC: 10.8 10*3/uL — ABNORMAL HIGH (ref 4.0–10.5)
nRBC: 0 % (ref 0.0–0.2)

## 2022-01-07 LAB — COMPREHENSIVE METABOLIC PANEL
ALT: 18 U/L (ref 0–44)
AST: 22 U/L (ref 15–41)
Albumin: 4.4 g/dL (ref 3.5–5.0)
Alkaline Phosphatase: 43 U/L (ref 38–126)
Anion gap: 8 (ref 5–15)
BUN: 10 mg/dL (ref 6–20)
CO2: 29 mmol/L (ref 22–32)
Calcium: 9.8 mg/dL (ref 8.9–10.3)
Chloride: 101 mmol/L (ref 98–111)
Creatinine, Ser: 0.92 mg/dL (ref 0.61–1.24)
GFR, Estimated: >60 mL/min (ref >60)
Glucose, Bld: 107 mg/dL — ABNORMAL HIGH (ref 70–99)
Potassium: 4.5 mmol/L (ref 3.5–5.1)
Sodium: 138 mmol/L (ref 135–145)
Total Bilirubin: 0.9 mg/dL (ref 0.3–1.2)
Total Protein: 7.1 g/dL (ref 6.5–8.1)

## 2022-01-07 LAB — RESP PANEL BY RT-PCR (RSV, FLU A&B, COVID)  RVPGX2
Influenza A by PCR: NEGATIVE
Influenza B by PCR: NEGATIVE
Resp Syncytial Virus by PCR: NEGATIVE
SARS Coronavirus 2 by RT PCR: NEGATIVE

## 2022-01-07 LAB — LIPID PANEL
Cholesterol: 146 mg/dL (ref 0–200)
HDL: 35 mg/dL — ABNORMAL LOW (ref >40)
LDL Cholesterol: 82 mg/dL (ref 0–99)
Total CHOL/HDL Ratio: 4.2 RATIO
Triglycerides: 143 mg/dL (ref <150)
VLDL: 29 mg/dL (ref 0–40)

## 2022-01-07 LAB — POC SARS CORONAVIRUS 2 AG: SARSCOV2ONAVIRUS 2 AG: NEGATIVE

## 2022-01-07 LAB — ETHANOL: Alcohol, Ethyl (B): <10 mg/dL (ref <10)

## 2022-01-07 LAB — TSH: TSH: 1.384 u[IU]/mL (ref 0.350–4.500)

## 2022-01-07 MED ORDER — LORAZEPAM 2 MG/ML IJ SOLN
INTRAMUSCULAR | Status: AC
  Administered 2022-01-07: 2 mg
  Filled 2022-01-07: qty 1

## 2022-01-07 MED ORDER — ZIPRASIDONE MESYLATE 20 MG IM SOLR
INTRAMUSCULAR | Status: AC
  Filled 2022-01-07: qty 20

## 2022-01-07 MED ORDER — MAGNESIUM HYDROXIDE 400 MG/5ML PO SUSP: 30.0000 mL | Freq: Every day | ORAL | Status: DC | PRN

## 2022-01-07 MED ORDER — OLANZAPINE 5 MG PO TBDP
5.0000 mg | ORAL_TABLET | Freq: Three times a day (TID) | ORAL | Status: DC | PRN
  Administered 2022-01-08: 5 mg via ORAL
  Filled 2022-01-07: qty 1

## 2022-01-07 MED ORDER — ZIPRASIDONE MESYLATE 20 MG IM SOLR
20.0000 mg | INTRAMUSCULAR | Status: AC | PRN
  Administered 2022-01-07: 20 mg via INTRAMUSCULAR

## 2022-01-07 MED ORDER — LORAZEPAM 1 MG PO TABS
1.0000 mg | ORAL_TABLET | ORAL | Status: AC | PRN
  Administered 2022-01-08: 1 mg via ORAL
  Filled 2022-01-07: qty 1

## 2022-01-07 MED ORDER — ARIPIPRAZOLE 10 MG PO TABS
10.0000 mg | ORAL_TABLET | Freq: Every day | ORAL | Status: DC
  Filled 2022-01-07: qty 1

## 2022-01-07 MED ORDER — TRAZODONE HCL 50 MG PO TABS: 50.0000 mg | ORAL_TABLET | Freq: Every evening | ORAL | Status: DC | PRN

## 2022-01-07 MED ORDER — ARIPIPRAZOLE 15 MG PO TABS
15.0000 mg | ORAL_TABLET | Freq: Every day | ORAL | Status: DC
  Administered 2022-01-08: 15 mg via ORAL
  Filled 2022-01-07: qty 1

## 2022-01-07 MED ORDER — ALUM & MAG HYDROXIDE-SIMETH 200-200-20 MG/5ML PO SUSP: 30.0000 mL | ORAL | Status: DC | PRN

## 2022-01-07 MED ORDER — ACETAMINOPHEN 325 MG PO TABS: 650.0000 mg | ORAL_TABLET | Freq: Four times a day (QID) | ORAL | Status: DC | PRN

## 2022-01-07 MED ORDER — HYDROXYZINE HCL 25 MG PO TABS
25.0000 mg | ORAL_TABLET | Freq: Three times a day (TID) | ORAL | Status: DC | PRN
  Administered 2022-01-08: 25 mg via ORAL
  Filled 2022-01-07 (×2): qty 1

## 2022-01-07 NOTE — Progress Notes (Signed)
Received Brent [name] in the assessment room, he is disorganized, but directable. The EKG was completed and the skin assessment. He was relocated to the OBS area, oriented and offered nourishments. Later he was assessed taking his shirt off. Shortly thereafter his behavior escalated to pounding on the door. He started talking extremely loud disturbing the entire milieu. He started praying. He was medicated per order and eventually drifted off to sleep in his chair bed.

## 2022-01-07 NOTE — ED Notes (Signed)
Pt disorganized, anxious, pacing. Refuses scheduled meds. Resp even and unlabored. Will continue to monitor for safety
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
## 2022-01-07 NOTE — ED Notes (Signed)
Pt is in the bed resting. Respirations are even and unlabored. No acute distress noted. Will continue to monitor for safety 
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
## 2022-01-07 NOTE — Progress Notes (Signed)
LCSW Progress Note  [id number]   Brent [name]  01/07/2022  2:49 PM  Description:   Inpatient Psychiatric Referral  Patient was recommended inpatient per Karsten Ro, MD. There are no available beds at [facility]. Patient was referred to the following facilities:   Destination  Service Provider Address Phone Fax  [facility]  [address], [place] [address] [phone]  [facility] [address]  [address], [place] [address] [phone]  [facility]  [address] [place], [place] [address] [phone]  [facility]  [address] [place], [place] [address] [phone]  [facility]  [address] [place] [address] [phone]  [facility]  [address] [place] [address] [phone]  [facility]  [address] [place], [place] [address] [phone]  [facility]  [address] [place] [address] [phone]  [facility]  [address] [place] [address] [phone]  [facility]  [address] [place] [address] [phone]  [facility]  [address] [place] [address] [phone]  [facility]  [address] [place] [address] [phone]  [facility]  [address] [place] [address] [phone]  [facility]  [address], [place] [address] [phone]  [facility]  [address] [place] [address] [phone]  [facility]  [address] [place], [place] [address]  [phone]  [facility]  [address] [place] [address] [phone]  [facility]  [address] [place] [address] [phone]  [facility]  [address], [place] [address] [phone]    Situation ongoing, CSW to continue following and update chart as more information becomes available.      [name]  01/07/2022 2:49 PM

## 2022-01-07 NOTE — ED Notes (Signed)
Pt is resting quietly on recliner bed. Will continue to [facility] for safety
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
## 2022-01-07 NOTE — ED Notes (Signed)
Pt is sleeping. No distress noted. Will continue to monitor safety. 

## 2022-01-07 NOTE — ED Notes (Signed)
Pt agitated, yelling, kicking at door, will not follow directions.. MD and security called to unit. PRN IM meds given per order

## 2022-01-07 NOTE — ED Notes (Signed)
Pt sleeping in recliner chair. Resp are even and unlabored. Will continue to monitor for safety.

## 2022-01-08 ENCOUNTER — Other Ambulatory Visit: Payer: Self-pay

## 2022-01-08 ENCOUNTER — Encounter (HOSPITAL_COMMUNITY): Payer: Self-pay | Admitting: Psychiatry

## 2022-01-08 ENCOUNTER — Inpatient Hospital Stay (HOSPITAL_COMMUNITY)
Admission: AD | Admit: 2022-01-08 | Discharge: 2022-01-14 | DRG: 885 | Disposition: A | Discharge status: Home / Self Care | Payer: BC Managed Care – PPO | Source: Other Acute Inpatient Hospital | Attending: Psychiatry | Admitting: Psychiatry

## 2022-01-08 DIAGNOSIS — F200 Paranoid schizophrenia: Secondary | ICD-10-CM | POA: Diagnosis not present

## 2022-01-08 DIAGNOSIS — Z91014 Allergy to mammalian meats: Secondary | ICD-10-CM | POA: Diagnosis not present

## 2022-01-08 DIAGNOSIS — F251 Schizoaffective disorder, depressive type: Principal | ICD-10-CM | POA: Diagnosis present

## 2022-01-08 DIAGNOSIS — Z79899 Other long term (current) drug therapy: Secondary | ICD-10-CM | POA: Diagnosis not present

## 2022-01-08 DIAGNOSIS — Z91013 Allergy to seafood: Secondary | ICD-10-CM

## 2022-01-08 DIAGNOSIS — F4310 Post-traumatic stress disorder, unspecified: Secondary | ICD-10-CM | POA: Diagnosis present

## 2022-01-08 DIAGNOSIS — R4584 Anhedonia: Secondary | ICD-10-CM | POA: Diagnosis present

## 2022-01-08 DIAGNOSIS — Z888 Allergy status to other drugs, medicaments and biological substances status: Secondary | ICD-10-CM | POA: Diagnosis not present

## 2022-01-08 DIAGNOSIS — R41843 Psychomotor deficit: Secondary | ICD-10-CM | POA: Diagnosis present

## 2022-01-08 DIAGNOSIS — F209 Schizophrenia, unspecified: Secondary | ICD-10-CM | POA: Diagnosis present

## 2022-01-08 DIAGNOSIS — Z833 Family history of diabetes mellitus: Secondary | ICD-10-CM

## 2022-01-08 DIAGNOSIS — Z91148 Patient's other noncompliance with medication regimen for other reason: Secondary | ICD-10-CM | POA: Diagnosis not present

## 2022-01-08 DIAGNOSIS — F259 Schizoaffective disorder, unspecified: Principal | ICD-10-CM

## 2022-01-08 DIAGNOSIS — Z91018 Allergy to other foods: Secondary | ICD-10-CM

## 2022-01-08 DIAGNOSIS — G479 Sleep disorder, unspecified: Secondary | ICD-10-CM | POA: Diagnosis present

## 2022-01-08 LAB — RAPID URINE DRUG SCREEN, HOSP PERFORMED
Amphetamines: NOT DETECTED
Barbiturates: NOT DETECTED
Benzodiazepines: POSITIVE — AB
Cocaine: NOT DETECTED
Opiates: NOT DETECTED
Tetrahydrocannabinol: NOT DETECTED

## 2022-01-08 LAB — HEMOGLOBIN A1C
Hgb A1c MFr Bld: 5.3 % (ref 4.8–5.6)
Mean Plasma Glucose: 105 mg/dL

## 2022-01-08 MED ORDER — ALUM & MAG HYDROXIDE-SIMETH 200-200-20 MG/5ML PO SUSP: 30.0000 mL | ORAL | Status: DC | PRN

## 2022-01-08 MED ORDER — ACETAMINOPHEN 325 MG PO TABS: 650.0000 mg | ORAL_TABLET | Freq: Four times a day (QID) | ORAL | Status: DC | PRN

## 2022-01-08 MED ORDER — TRAZODONE HCL 50 MG PO TABS
50.0000 mg | ORAL_TABLET | Freq: Every evening | ORAL | Status: DC | PRN
  Administered 2022-01-08: 50 mg via ORAL
  Filled 2022-01-08: qty 1

## 2022-01-08 MED ORDER — HYDROXYZINE HCL 25 MG PO TABS
25.0000 mg | ORAL_TABLET | Freq: Three times a day (TID) | ORAL | Status: DC | PRN
  Administered 2022-01-08 – 2022-01-12 (×5): 25 mg via ORAL
  Filled 2022-01-08 (×6): qty 1

## 2022-01-08 MED ORDER — MAGNESIUM HYDROXIDE 400 MG/5ML PO SUSP: 30.0000 mL | Freq: Every day | ORAL | Status: DC | PRN

## 2022-01-08 MED ORDER — ZIPRASIDONE MESYLATE 20 MG IM SOLR: 20.0000 mg | INTRAMUSCULAR | Status: DC | PRN

## 2022-01-08 MED ORDER — ARIPIPRAZOLE 15 MG PO TABS
15.0000 mg | ORAL_TABLET | Freq: Every day | ORAL | Status: DC
  Administered 2022-01-09 – 2022-01-10 (×2): 15 mg via ORAL
  Filled 2022-01-08 (×3): qty 1

## 2022-01-08 MED ORDER — OLANZAPINE 5 MG PO TBDP: 5.0000 mg | ORAL_TABLET | Freq: Three times a day (TID) | ORAL | Status: DC | PRN

## 2022-01-08 MED ORDER — LORAZEPAM 1 MG PO TABS
1.0000 mg | ORAL_TABLET | ORAL | Status: AC | PRN
  Administered 2022-01-11: 1 mg via ORAL
  Filled 2022-01-08: qty 1

## 2022-01-08 NOTE — ED Provider Notes (Signed)
FBC/OBS ASAP Discharge Summary  Date and Time: 01/08/2022 2:10 PM  Name: Brent [name]  MRN:  [id number]   Discharge Diagnoses:  Final diagnoses:  Schizophrenia, unspecified type [name])    Subjective:  Pt is a 41 year old male with past psychiatric history of Schizophrenia presented to [facility] accompanied with brother and family for reevaluation. Pt was evaluated at [facility] last night. He was given Abilify 10 mg yesterday at [facility] and was prescribed Abilify 5 mg daily, Cogentin and trazodone 50 mg nightly as needed for sleep.  Patient has not picked up medications yet.    Patient evaluated at bedside, denies suicidal ideation and homicidal ideation. Reports fair sleep and appetite. He denies auditory and visual hallucinations.   On assessment he denies paranoid ideation, but appears suspicious and guarded. Discussed transfer to inpatient hospitalization, states" will you take me to the police?". Is willing to sign voluntary form but perseverates on being sent to the police. Discussed requesting IVC as patient is at risk for elopement and exhibits paranoid behavior.    He denies any somatic complaints. Denies any side effects to currently prescribed psychiatric medications.   Stay Summary:  Patient presented with increased agitation after non-compliance to Ablifiy Maintenna.   Initial assessment by accepting provider: Patient is poor historian. He appears confused and answers " I do not know " to most questions.  He does not talk much and only answers in short sentences and yes and no responses. He states he has not been able to sleep well and only sleeps for a few hours.  He reports that he has been feeling depressed, and reports poor appetite, low energy, fatigue, poor memory and concentration.   He denies SI, HI, AVH.  He reports racing thoughts and decreased need for sleep but denies manic episodes.  He had been on Abilify maintainna in the past but has not been taking any medications  for last 6 months.  Per family, He was mentating well without medication until recently when his insomnia, depression and paranoia got worse for last few weeks.     He reports that he had psychiatrist in the past but he does not remember his name.   During the patient's hospitalization, patient had extensive initial psychiatric evaluation, and follow-up psychiatric evaluations every day.  Psychiatric diagnoses provided upon initial assessment: Schizophrenia  Patient's psychiatric medications were adjusted on admission:  -Started Abilify 10 mg daily, with plans to increase  -Hydroxyzine 25 mg 3 times daily as needed for anxiety -Trazodone 50 mg nightly as needed for insomnia. - Continue Agitation Protocol with Zyprexa, Ativan and Geodone - Other meds- Tylenol, MOM, Maalox  During the hospitalization, other adjustments were made to the patient's psychiatric medication regimen:  - Increased Abilify 10 mg to 15 mg daily   Total Time spent with patient: 15 minutes  Past Psychiatric History: Schizophrenia Past Medical History:  Past Medical History:  Diagnosis Date   PTSD (post-traumatic stress disorder)    Schizophrenia (HCC)    Sleep disorder, unspecified    No past surgical history on file. Family History:  Family History  Problem Relation Age of Onset   Mental illness Neg Hx    Family Psychiatric History: No significant family history Social History:  Social History   Substance and Sexual Activity  Alcohol Use No     Social History   Substance and Sexual Activity  Drug Use No    Social History   Socioeconomic History  Marital status: Single    Spouse name: Not on file   Number of children: Not on file   Years of education: Not on file   Highest education level: Not on file  Occupational History   Not on file  Tobacco Use   Smoking status: Never   Smokeless tobacco: Never  Substance and Sexual Activity   Alcohol use: No   Drug use: No   Sexual activity: Not on  file  Other Topics Concern   Not on file  Social History Narrative   Not on file   Social Determinants of Health   Financial Resource Strain: Not on file  Food Insecurity: Not on file  Transportation Needs: Not on file  Physical Activity: Not on file  Stress: Not on file  Social Connections: Not on file   SDOH:  SDOH Screenings   Alcohol Screen: Low Risk  (12/14/2016)  Tobacco Use: Low Risk  (01/25/2020)    Tobacco Cessation:  N/A, patient does not currently use tobacco products  Current Medications:  No current facility-administered medications for this encounter.   No current outpatient medications on file.    PTA Medications: (Not in a hospital admission)       No data to display          Flowsheet Row ED from 01/07/2022 in [facility]  C-SSRS RISK CATEGORY No Risk       Musculoskeletal  Strength & Muscle Tone: within normal limits Gait & Station: normal Patient leans: N/A  Psychiatric Specialty Exam  Presentation  General Appearance:  Fairly Groomed  Eye Contact: Fair  Speech: Clear and Coherent  Speech Volume: Normal  Handedness: Right   Mood and Affect  Mood: Anxious  Affect: Flat   Thought Process  Thought Processes: Coherent  Descriptions of Associations:Intact  Orientation:Partial  Thought Content:Paranoid Ideation  Diagnosis of Schizophrenia or Schizoaffective disorder in past: Yes  Duration of Psychotic Symptoms: Greater than six months   Hallucinations:Hallucinations: None  Ideas of Reference:Paranoia  Suicidal Thoughts:Suicidal Thoughts: No  Homicidal Thoughts:Homicidal Thoughts: No   Sensorium  Memory: Immediate Poor; Recent Poor; Remote Poor  Judgment: Poor  Insight: Poor   Executive Functions  Concentration: Fair  Attention Span: Fair  Recall: Poor  Fund of Knowledge: Poor  Language: Poor   Psychomotor Activity  Psychomotor Activity: Psychomotor  Activity: Normal   Assets  Assets: Social Support; Physical Health; Transportation   Sleep  Sleep: Sleep: Fair   Nutritional Assessment (For OBS and FBC admissions only) Has the patient had a weight loss or gain of 10 pounds or more in the last 3 months?: No Has the patient had a decrease in food intake/or appetite?: Yes Does the patient have dental problems?: No Does the patient have eating habits or behaviors that may be indicators of an eating disorder including binging or inducing vomiting?: No Has the patient recently lost weight without trying?: 0 Has the patient been eating poorly because of a decreased appetite?: 1 Malnutrition Screening Tool Score: 1    Physical Exam  Physical Exam Constitutional:      General: He is not in acute distress.    Appearance: Normal appearance. He is not ill-appearing.  Pulmonary:     Effort: Pulmonary effort is normal.  Skin:    General: Skin is warm and dry.  Neurological:     Mental Status: He is alert.  Psychiatric:        Attention and Perception: He is inattentive.  Mood and Affect: Affect is flat.        Behavior: Behavior is withdrawn.        Thought Content: Thought content is paranoid.    Review of Systems  Respiratory:  Negative for shortness of breath.   Cardiovascular:  Negative for chest pain.  Gastrointestinal:  Negative for abdominal pain.  Psychiatric/Behavioral:  Negative for depression, hallucinations, memory loss, substance abuse and suicidal ideas. The patient is not nervous/anxious and does not have insomnia.    Blood pressure 114/81, pulse (!) 105, temperature (!) 96.1 F (35.6 C), resp. rate 20, height 5\' 7"  (1.702 m), weight 140 lb (63.5 kg), SpO2 100 %. Body mass index is 21.93 kg/m.  Demographic Factors:  Male  Loss Factors: NA  Historical Factors: Impulsivity  Risk Reduction Factors:   Living with another person, especially a relative  Continued Clinical Symptoms:  Schizophrenia:    Paranoid or undifferentiated type  Cognitive Features That Contribute To Risk:  Thought constriction (tunnel vision)    Suicide Risk:  Moderate:  Frequent suicidal ideation with limited intensity, and duration, some specificity in terms of plans, no associated intent, good self-control, limited dysphoria/symptomatology, some risk factors present, and identifiable protective factors, including available and accessible social support.  Plan Of Care/Follow-up recommendations:  Activity: as tolerated  Diet: heart healthy  Disposition: Transfer to [facility], receiving attending [facility].   Abbott Pao, MD 01/08/2022, 2:10 PM

## 2022-01-08 NOTE — ED Provider Notes (Deleted)
Behavioral Health Progress Note  Date and Time: 01/08/2022 1:01 PM Name: Brent [name] MRN:  [id number]  Subjective:   Pt is a 41 year old male with past psychiatric history of Schizophrenia presented to [facility] accompanied with brother and family for reevaluation. Pt was evaluated at [facility] last night. He was given Abilify 10 mg yesterday at [facility] and was prescribed Abilify 5 mg daily, Cogentin and trazodone 50 mg nightly as needed for sleep.  Patient has not picked up medications yet.   Patient evaluated at bedside, denies suicidal ideation and homicidal ideation. Reports fair sleep and appetite. He denies auditory and visual hallucinations.  On assessment he denies paranoid ideation, but appears suspicious and guarded. Discussed transfer to inpatient hospitalization, states" will you take me to the police?". Is willing to sign voluntary form but perseverates on being sent to the police. Discussed requesting IVC as patient is at risk for elopement and exhibits paranoid behavior.   He denies any somatic complaints. Denies any side effects to currently prescribed psychiatric medications.   Diagnosis:  Final diagnoses:  Schizophrenia, unspecified type (HCC)    Total Time spent with patient: 15 minutes  Past Psychiatric History: Schizophrenia   Past Medical History:  Past Medical History:  Diagnosis Date   PTSD (post-traumatic stress disorder)    Schizophrenia (HCC)    Sleep disorder, unspecified    No past surgical history on file. Family History:  Family History  Problem Relation Age of Onset   Mental illness Neg Hx    Family Psychiatric  History:      Social History:  Social History   Substance and Sexual Activity  Alcohol Use No     Social History   Substance and Sexual Activity  Drug Use No    Social History   Socioeconomic History   Marital status: Single    Spouse name: Not on file   Number of children: Not on file   Years of education: Not on file    Highest education level: Not on file  Occupational History   Not on file  Tobacco Use   Smoking status: Never   Smokeless tobacco: Never  Substance and Sexual Activity   Alcohol use: No   Drug use: No   Sexual activity: Not on file  Other Topics Concern   Not on file  Social History Narrative   Not on file   Social Determinants of Health   Financial Resource Strain: Not on file  Food Insecurity: Not on file  Transportation Needs: Not on file  Physical Activity: Not on file  Stress: Not on file  Social Connections: Not on file   SDOH:  SDOH Screenings   Alcohol Screen: Low Risk  (12/14/2016)  Tobacco Use: Low Risk  (01/25/2020)   Additional Social History:    Pain Medications: See MAR Prescriptions: See MAR Over the Counter: See MAR History of alcohol / drug use?: No history of alcohol / drug abuse                    Sleep: Fair  Appetite:  Fair  Current Medications:  Current Facility-Administered Medications  Medication Dose Route Frequency Provider Last Rate Last Admin   acetaminophen (TYLENOL) tablet 650 mg  650 mg Oral Q6H PRN Doda, Vandana, MD       alum & mag hydroxide-simeth (MAALOX/MYLANTA) 200-200-20 MG/5ML suspension 30 mL  30 mL Oral Q4H PRN Karsten Ro, MD  ARIPiprazole (ABILIFY) tablet 15 mg  15 mg Oral Daily Karsten Ro, MD   15 mg at 01/08/22 1914   hydrOXYzine (ATARAX) tablet 25 mg  25 mg Oral TID PRN Karsten Ro, MD   25 mg at 01/08/22 0532   magnesium hydroxide (MILK OF MAGNESIA) suspension 30 mL  30 mL Oral Daily PRN Karsten Ro, MD       OLANZapine zydis (ZYPREXA) disintegrating tablet 5 mg  5 mg Oral Q8H PRN Karsten Ro, MD   5 mg at 01/08/22 0533   traZODone (DESYREL) tablet 50 mg  50 mg Oral QHS PRN Karsten Ro, MD       Current Outpatient Medications  Medication Sig Dispense Refill   ARIPiprazole (ABILIFY) 5 MG tablet Take 1 tablet (5 mg total) by mouth daily. 10 tablet 0   benztropine (COGENTIN) 1 MG tablet Take  0.5 tablets (0.5 mg total) by mouth daily. 10 tablet 0   traZODone (DESYREL) 50 MG tablet Take 1 tablet (50 mg total) by mouth at bedtime as needed for sleep. 10 tablet 0    Labs  Lab Results:  Admission on 01/07/2022  Component Date Value Ref Range Status   SARS Coronavirus 2 by RT PCR 01/07/2022 NEGATIVE  NEGATIVE Final   Comment: (NOTE) SARS-CoV-2 target nucleic acids are NOT DETECTED.  The SARS-CoV-2 RNA is generally detectable in upper respiratory specimens during the acute phase of infection. The lowest concentration of SARS-CoV-2 viral copies this assay can detect is 138 copies/mL. A negative result does not preclude SARS-Cov-2 infection and should not be used as the sole basis for treatment or other patient management decisions. A negative result may occur with  improper specimen collection/handling, submission of specimen other than nasopharyngeal swab, presence of viral mutation(s) within the areas targeted by this assay, and inadequate number of viral copies(<138 copies/mL). A negative result must be combined with clinical observations, patient history, and epidemiological information. The expected result is Negative.  Fact Sheet for Patients:  BloggerCourse.com  Fact Sheet for Healthcare Providers:  SeriousBroker.it  This test is no                          t yet approved or cleared by the [place] FDA and  has been authorized for detection and/or diagnosis of SARS-CoV-2 by FDA under an Emergency Use Authorization (EUA). This EUA will remain  in effect (meaning this test can be used) for the duration of the COVID-19 declaration under Section 564(b)(1) of the Act, 21 U.S.C.section 360bbb-3(b)(1), unless the authorization is terminated  or revoked sooner.       Influenza A by PCR 01/07/2022 NEGATIVE  NEGATIVE Final   Influenza B by PCR 01/07/2022 NEGATIVE  NEGATIVE Final   Comment: (NOTE) The Xpert Xpress  SARS-CoV-2/FLU/RSV plus assay is intended as an aid in the diagnosis of influenza from Nasopharyngeal swab specimens and should not be used as a sole basis for treatment. Nasal washings and aspirates are unacceptable for Xpert Xpress SARS-CoV-2/FLU/RSV testing.  Fact Sheet for Patients: BloggerCourse.com  Fact Sheet for Healthcare Providers: SeriousBroker.it  This test is not yet approved or cleared by the [place] FDA and has been authorized for detection and/or diagnosis of SARS-CoV-2 by FDA under an Emergency Use Authorization (EUA). This EUA will remain in effect (meaning this test can be used) for the duration of the COVID-19 declaration under Section 564(b)(1) of the Act, 21 U.S.C. section 360bbb-3(b)(1), unless the authorization is terminated or  revoked.     Resp Syncytial Virus by PCR 01/07/2022 NEGATIVE  NEGATIVE Final   Comment: (NOTE) Fact Sheet for Patients: BloggerCourse.com  Fact Sheet for Healthcare Providers: SeriousBroker.it  This test is not yet approved or cleared by the [place] FDA and has been authorized for detection and/or diagnosis of SARS-CoV-2 by FDA under an Emergency Use Authorization (EUA). This EUA will remain in effect (meaning this test can be used) for the duration of the COVID-19 declaration under Section 564(b)(1) of the Act, 21 U.S.C. section 360bbb-3(b)(1), unless the authorization is terminated or revoked.  Performed at [facility], [address] [place], [place] [address]    WBC 01/07/2022 10.8 (H)  4.0 - 10.5 K/uL Final   RBC 01/07/2022 4.30  4.22 - 5.81 MIL/uL Final   Hemoglobin 01/07/2022 13.1  13.0 - 17.0 g/dL Final   HCT 83/66/2947 38.8 (L)  39.0 - 52.0 % Final   MCV 01/07/2022 90.2  80.0 - 100.0 fL Final   MCH 01/07/2022 30.5  26.0 - 34.0 pg Final   MCHC 01/07/2022 33.8  30.0 - 36.0 g/dL Final   RDW 65/46/5035  12.2  11.5 - 15.5 % Final   Platelets 01/07/2022 335  150 - 400 K/uL Final   nRBC 01/07/2022 0.0  0.0 - 0.2 % Final   Neutrophils Relative % 01/07/2022 76  % Final   Neutro Abs 01/07/2022 8.1 (H)  1.7 - 7.7 K/uL Final   Lymphocytes Relative 01/07/2022 18  % Final   Lymphs Abs 01/07/2022 1.9  0.7 - 4.0 K/uL Final   Monocytes Relative 01/07/2022 6  % Final   Monocytes Absolute 01/07/2022 0.6  0.1 - 1.0 K/uL Final   Eosinophils Relative 01/07/2022 0  % Final   Eosinophils Absolute 01/07/2022 0.0  0.0 - 0.5 K/uL Final   Basophils Relative 01/07/2022 0  % Final   Basophils Absolute 01/07/2022 0.0  0.0 - 0.1 K/uL Final   Immature Granulocytes 01/07/2022 0  % Final   Abs Immature Granulocytes 01/07/2022 0.04  0.00 - 0.07 K/uL Final   Performed at [facility], [address], [place], [place] [address]   Sodium 01/07/2022 138  135 - 145 mmol/L Final   Potassium 01/07/2022 4.5  3.5 - 5.1 mmol/L Final   Chloride 01/07/2022 101  98 - 111 mmol/L Final   CO2 01/07/2022 29  22 - 32 mmol/L Final   Glucose, Bld 01/07/2022 107 (H)  70 - 99 mg/dL Final   Glucose reference range applies only to samples taken after fasting for at least 8 hours.   BUN 01/07/2022 10  6 - 20 mg/dL Final   Creatinine, Ser 01/07/2022 0.92  0.61 - 1.24 mg/dL Final   Calcium 12/75/1700 9.8  8.9 - 10.3 mg/dL Final   Total Protein 17/49/4496 7.1  6.5 - 8.1 g/dL Final   Albumin 75/91/6384 4.4  3.5 - 5.0 g/dL Final   AST 66/59/9357 22  15 - 41 U/L Final   ALT 01/07/2022 18  0 - 44 U/L Final   Alkaline Phosphatase 01/07/2022 43  38 - 126 U/L Final   Total Bilirubin 01/07/2022 0.9  0.3 - 1.2 mg/dL Final   GFR, Estimated 01/07/2022 >60  >60 mL/min Final   Comment: (NOTE) Calculated using the CKD-EPI Creatinine Equation (2021)    Anion gap 01/07/2022 8  5 - 15 Final   Performed at [facility], [address], [place], [place] [address]   Hgb A1c  MFr Bld 01/07/2022 5.3  4.8 - 5.6 % Final   Comment: (NOTE)          Prediabetes: 5.7 - 6.4         Diabetes: >6.4         Glycemic control for adults with diabetes: <7.0    Mean Plasma Glucose 01/07/2022 105  mg/dL Final   Comment: (NOTE) Performed At: [facility] [address] [place], [place] [id number] Jolene SchimkeNagendra Sanjai MD BJ:[id number]:[phone]    Alcohol, Ethyl (B) 01/07/2022 <10  <10 mg/dL Final   Comment: (NOTE) Lowest detectable limit for serum alcohol is 10 mg/dL.  For medical purposes only. Performed at [facility], [address] [place], [place] [address]    Cholesterol 01/07/2022 146  0 - 200 mg/dL Final   Triglycerides 84/69/629512/25/2023 143  <150 mg/dL Final   HDL 28/41/324412/25/2023 35 (L)  >40 mg/dL Final   Total CHOL/HDL Ratio 01/07/2022 4.2  RATIO Final   VLDL 01/07/2022 29  0 - 40 mg/dL Final   LDL Cholesterol 01/07/2022 82  0 - 99 mg/dL Final   Comment:        Total Cholesterol/HDL:CHD Risk Coronary Heart Disease Risk Table                     Men   Women  1/2 Average Risk   3.4   3.3  Average Risk       5.0   4.4  2 X Average Risk   9.6   7.1  3 X Average Risk  23.4   11.0        Use the calculated Patient Ratio above and the CHD Risk Table to determine the patient's CHD Risk.        ATP III CLASSIFICATION (LDL):  <100     mg/dL   Optimal  010-272100-129  mg/dL   Near or Above                    Optimal  130-159  mg/dL   Borderline  536-644160-189  mg/dL   High  >034>190     mg/dL   Very High Performed at [facility], [address] [place], [place] [address]    TSH 01/07/2022 1.384  0.350 - 4.500 uIU/mL Final   Comment: Performed by a 3rd Generation assay with a functional sensitivity of <=0.01 uIU/mL. Performed at [facility], [address] [place], [place] [address]    SARSCOV2ONAVIRUS 2 AG 01/07/2022 NEGATIVE  NEGATIVE Final   Comment: (NOTE) SARS-CoV-2 antigen NOT DETECTED.   Negative results are presumptive.  Negative results do not preclude SARS-CoV-2 infection and should not be used as the sole basis  for treatment or other patient management decisions, including infection  control decisions, particularly in the presence of clinical signs and  symptoms consistent with COVID-19, or in those who have been in contact with the virus.  Negative results must be combined with clinical observations, patient history, and epidemiological information. The expected result is Negative.  Fact Sheet for Patients: https://www.jennings-kim.com/https://www.fda.gov/media/141569/download  Fact Sheet for Healthcare Providers: https://alexander-rogers.biz/https://www.fda.gov/media/141568/download  This test is not yet approved or cleared by the [place] FDA and  has been authorized for detection and/or diagnosis of SARS-CoV-2 by FDA under an Emergency Use Authorization (EUA).  This EUA will remain in effect (meaning this test can be used) for the duration of  the COV  ID-19 declaration under Section 564(b)(1) of the Act, 21 U.S.C. section 360bbb-3(b)(1), unless the authorization is terminated or revoked sooner.      Blood Alcohol level:  Lab Results  Component Value Date   ETH <10 01/07/2022   ETH <5 08/19/2016    Metabolic Disorder Labs: Lab Results  Component Value Date   HGBA1C 5.3 01/07/2022   MPG 105 01/07/2022   MPG 79.58 08/23/2016   Lab Results  Component Value Date   PROLACTIN 17.0 (H) 08/23/2016   PROLACTIN 17.0 (H) 02/02/2016   Lab Results  Component Value Date   CHOL 146 01/07/2022   TRIG 143 01/07/2022   HDL 35 (L) 01/07/2022   CHOLHDL 4.2 01/07/2022   VLDL 29 01/07/2022   LDLCALC 82 01/07/2022   LDLCALC 93 08/23/2016    Therapeutic Lab Levels: No results found for: "LITHIUM" No results found for: "VALPROATE" No results found for: "CBMZ"  Physical Findings   AIMS    Flowsheet Row [facility]) [facility] Ad[facility] (Discharged) from OP [facility] Ad[facility] (Discharged)  from [facility]  AIMS Total Score 0 0 0      AUDIT    Flowsheet Row [facility]) [facility] Admission (Discharged) from OP Visit [facility] Admission (Discharged) from OP [facility]  Alcohol Use Disorder Identification Test Final Score (AUDIT) 0 0 0      Flowsheet Row ED from 01/07/2022 in [facility]  C-SSRS RISK CATEGORY No Risk        Musculoskeletal  Strength & Muscle Tone: within normal limits Gait & Station: normal Patient leans: N/A  Psychiatric Specialty Exam  Presentation  General Appearance:  Fairly Groomed  Eye Contact: Fair  Speech: Clear and Coherent  Speech Volume: Normal  Handedness: Right   Mood and Affect  Mood: Anxious  Affect: Flat   Thought Process  Thought Processes: Coherent  Descriptions of Associations:Intact  Orientation:Partial  Thought Content:Paranoid Ideation  Diagnosis of Schizophrenia or Schizoaffective disorder in past: Yes  Duration of Psychotic Symptoms: Greater than six months   Hallucinations:Hallucinations: None  Ideas of Reference:Paranoia  Suicidal Thoughts:Suicidal Thoughts: No  Homicidal Thoughts:Homicidal Thoughts: No   Sensorium  Memory: Immediate Poor; Recent Poor; Remote Poor  Judgment: Poor  Insight: Poor   Executive Functions  Concentration: Fair  Attention Span: Fair  Recall: Poor  Fund of Knowledge: Poor  Language: Poor   Psychomotor Activity  Psychomotor Activity: Psychomotor Activity: Normal   Assets  Assets: Social Support; Physical Health; Transportation   Sleep  Sleep: Sleep: Fair   Nutritional Assessment (For OBS and FBC admissions only) Has the patient had a weight loss or gain of 10  pounds or more in the last 3 months?: No Has the patient had a decrease in food intake/or appetite?: Yes Does the patient have dental problems?: No Does the patient have eating habits or behaviors that may be indicators of an eating disorder including binging or inducing vomiting?: No Has the patient recently lost weight without trying?: 0 Has the patient been eating poorly because of a decreased appetite?: 1 Malnutrition Screening Tool Score: 1    Physical Exam  Physical Exam Constitutional:      General: He is not in acute distress.  Appearance: He is normal weight. He is not ill-appearing.  Pulmonary:     Effort: Pulmonary effort is normal. No respiratory distress.  Musculoskeletal:        General: Normal range of motion.  Skin:    General: Skin is warm and dry.  Neurological:     General: No focal deficit present.     Mental Status: He is alert.  Psychiatric:        Attention and Perception: He does not perceive auditory or visual hallucinations.        Mood and Affect: Affect is flat.        Speech: Speech normal.        Behavior: Behavior is withdrawn.        Thought Content: Thought content is paranoid. Thought content is not delusional. Thought content does not include homicidal or suicidal plan.    ROS Blood pressure 114/81, pulse (!) 105, temperature (!) 96.1 F (35.6 C), resp. rate 20, height  (1.702 m), weight 140 lb (63.5 kg), SpO2 100 %. Body mass index is 21.93 kg/m.  Assessment and Plan Pt is admitted for Observation at [facility].  Patient meets criteria for inpatient hospitalization, continues to be guarded and paranoid. IVC submitted as there is concern for elopement.  -Monitor SI/HI/AVH/Agitation  Schziophrenia - Start Abilify to 15 mg today - Hydroxyzine 25 mg 3 times daily as needed for anxiety - Trazodone 50 mg nightly as needed for insomnia. - Continue Agitation Protocol with Zyprexa, Ativan and Geodone - Other meds- Tylenol, MOM, Maalox  Dispo:  Pending transfer to [facility] under IVC, receiving physician Dr. Abbott Pao.    Lorri Frederick, MD PGY-1 01/08/2022 1:01 PM

## 2022-01-08 NOTE — Progress Notes (Addendum)
Pt was accepted to [facility] TODAY 01/08/2022; Bed assignment: 504-1  Pt meets inpatient criteria per Lorri Frederick, MD  Attending Physician will be Dr. Abbott Pao, MD  Report can be called to: Adult unit: [phone]  Pt can arrive after 1300  Care Team Notified: [facility] Rona Ravens, RN, Lorri Frederick, MD, and Malva Limes, RN  [place], [place]  01/08/2022 10:00 AM

## 2022-01-08 NOTE — ED Notes (Signed)
Pt is sleeping. No distress noted. Will continue to monitor safety. 
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
## 2022-01-08 NOTE — ED Notes (Signed)
Pt presents with confusion, disorganized thought process. The patient states '' why am I here, what am I doing here? '' He appears confused and with limited understanding of previous issues resulting in presentation to [facility]. Pt denies any SI HI or AVH and states he would like to discharge, but is not forthcoming of thoughts or anything going on to bring him back to [facility].  Pt offered breakfast which he accepted without issue. He did take am medication but was observed to be paranoid about taking it, requesting to see medication packaging prior to accepting it.  Pt is safe, able to make his needs known.Will con't to monitor.

## 2022-01-08 NOTE — Progress Notes (Signed)
Pt stated he wanted to get some phone numbers out of his phone. Staff looked at the belongings sheet and checked the locker and there is no phone or documentation of a phone in any of the paperwork or the chart. Will continue to monitor, pt stated he called his wife and she stated the phone was in his pocket , but there is no indication that he had the phone when coming to [facility].

## 2022-01-08 NOTE — ED Notes (Signed)
Report called to Dairl Ponder, RN at [facility]. Also called for IVC service for patient to be served, and then transported to [facility]. [facility] aware.

## 2022-01-08 NOTE — Progress Notes (Signed)
CSW received phone call from [place] at [facility]. Pt is currently under review for inpatient treatment. CSW will continue to assist and follow with placement.  [name]  01/08/2022 9:33 AM

## 2022-01-08 NOTE — ED Notes (Signed)
All belongings returned to [employer]. 3 copies of IVC paperwork included and pt escorted from unit ambulatory in no acute distress to transport to [facility].

## 2022-01-08 NOTE — Progress Notes (Addendum)
Pt stated he was having racing thoughts at times and AVH due to when he was a little kid in [place] his mother would send him to the store and frequently he would see dead people in the streets (recently killed) . Pt did state the AVH were getting better .    01/08/22 1945  Psych Admission Type (Psych Patients Only)  Admission Status Involuntary  Psychosocial Assessment  Patient Complaints Anxiety  Eye Contact Fair  Facial Expression Anxious  Affect Anxious  Speech Logical/coherent  Interaction Assertive  Motor Activity Slow  Appearance/Hygiene Unremarkable  Behavior Characteristics Cooperative;Anxious  Mood Anxious  Aggressive Behavior  Effect No apparent injury  Thought Process  Coherency Circumstantial  Content WDL  Delusions None reported or observed  Perception Hallucinations  Hallucination Auditory;Visual  Judgment Impaired  Confusion None  Danger to Self  Current suicidal ideation? Denies  Danger to Others  Danger to Others None reported or observed

## 2022-01-08 NOTE — Tx Team (Signed)
Initial Treatment Plan 01/08/2022 3:53 PM Brent [name] [id number]    PATIENT STRESSORS: Medication change or noncompliance     PATIENT STRENGTHS: Religious Affiliation  Supportive family/friends  Work skills    PATIENT IDENTIFIED PROBLEMS: Medication non-compliance  Medication Management                   DISCHARGE CRITERIA:  Improved stabilization in mood, thinking, and/or behavior Verbal commitment to aftercare and medication compliance  PRELIMINARY DISCHARGE PLAN: Return to previous living arrangement  PATIENT/FAMILY INVOLVEMENT: This treatment plan has been presented to and reviewed with the patient, [name]. The patient has been given the opportunity to ask questions and make suggestions.  Roseanne Reno, RN 01/08/2022, 3:53 PM

## 2022-01-09 ENCOUNTER — Encounter (HOSPITAL_COMMUNITY): Payer: Self-pay

## 2022-01-09 DIAGNOSIS — F259 Schizoaffective disorder, unspecified: Principal | ICD-10-CM

## 2022-01-09 DIAGNOSIS — F200 Paranoid schizophrenia: Secondary | ICD-10-CM | POA: Diagnosis not present

## 2022-01-09 DIAGNOSIS — F4310 Post-traumatic stress disorder, unspecified: Secondary | ICD-10-CM | POA: Insufficient documentation

## 2022-01-09 MED ORDER — MELATONIN 3 MG PO TABS
3.0000 mg | ORAL_TABLET | Freq: Every evening | ORAL | Status: DC | PRN
  Administered 2022-01-09: 3 mg via ORAL
  Filled 2022-01-09: qty 1
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
## 2022-01-09 NOTE — BH IP Treatment Plan (Addendum)
Interdisciplinary Treatment and Diagnostic Plan Update  01/09/2022 Time of Session: 9:40am  Brent [name] MRN: [id number]  Principal Diagnosis: <principal problem not specified>  Secondary Diagnoses: Active Problems:   Schizophrenia ([place])   Current Medications:  Current Facility-Administered Medications  Medication Dose Route Frequency Provider Last Rate Last Admin   acetaminophen (TYLENOL) tablet 650 mg  650 mg Oral Q6H PRN Carrion-Carrero, Margely, MD       alum & mag hydroxide-simeth (MAALOX/MYLANTA) 200-200-20 MG/5ML suspension 30 mL  30 mL Oral Q4H PRN Carrion-Carrero, Margely, MD       ARIPiprazole (ABILIFY) tablet 15 mg  15 mg Oral Daily Carrion-Carrero, Margely, MD   15 mg at 01/09/22 0825   hydrOXYzine (ATARAX) tablet 25 mg  25 mg Oral TID PRN Christene Slates, MD   25 mg at 01/08/22 2034   OLANZapine zydis (ZYPREXA) disintegrating tablet 5 mg  5 mg Oral Q8H PRN Carrion-Carrero, Margely, MD       And   LORazepam (ATIVAN) tablet 1 mg  1 mg Oral PRN Carrion-Carrero, Margely, MD       And   ziprasidone (GEODON) injection 20 mg  20 mg Intramuscular PRN Carrion-Carrero, Margely, MD       magnesium hydroxide (MILK OF MAGNESIA) suspension 30 mL  30 mL Oral Daily PRN Carrion-Carrero, Margely, MD       traZODone (DESYREL) tablet 50 mg  50 mg Oral QHS PRN Carrion-Carrero, Margely, MD   50 mg at 01/08/22 2034   PTA Medications: Medications Prior to Admission  Medication Sig Dispense Refill Last Dose   ARIPiprazole (ABILIFY) 5 MG tablet Take 1 tablet (5 mg total) by mouth daily. 10 tablet 0    benztropine (COGENTIN) 1 MG tablet Take 0.5 tablets (0.5 mg total) by mouth daily. 10 tablet 0    traZODone (DESYREL) 50 MG tablet Take 1 tablet (50 mg total) by mouth at bedtime as needed for sleep. 10 tablet 0     Patient Stressors: Medication change or noncompliance    Patient Strengths: Religious Affiliation  Supportive family/friends  Work skills   Treatment Modalities:  Medication Management, Group therapy, Case management,  1 to 1 session with clinician, Psychoeducation, Recreational therapy.   Physician Treatment Plan for Primary Diagnosis: <principal problem not specified> Long Term Goal(s):     Short Term Goals: Ability to identify changes in lifestyle to reduce recurrence of condition will improve Ability to verbalize feelings will improve Ability to disclose and discuss suicidal ideas Ability to demonstrate self-control will improve Ability to identify and develop effective coping behaviors will improve Ability to maintain clinical measurements within normal limits will improve Compliance with prescribed medications will improve Ability to identify triggers associated with substance abuse/mental health issues will improve  Medication Management: Evaluate patient's response, side effects, and tolerance of medication regimen.  Therapeutic Interventions: 1 to 1 sessions, Unit Group sessions and Medication administration.  Evaluation of Outcomes: Not Met  Physician Treatment Plan for Secondary Diagnosis: Active Problems:   Schizophrenia ([place])  Long Term Goal(s):     Short Term Goals: Ability to identify changes in lifestyle to reduce recurrence of condition will improve Ability to verbalize feelings will improve Ability to disclose and discuss suicidal ideas Ability to demonstrate self-control will improve Ability to identify and develop effective coping behaviors will improve Ability to maintain clinical measurements within normal limits will improve Compliance with prescribed medications will improve Ability to identify triggers associated with substance abuse/mental health issues will improve     Medication  Management: Evaluate patient's response, side effects, and tolerance of medication regimen.  Therapeutic Interventions: 1 to 1 sessions, Unit Group sessions and Medication administration.  Evaluation of Outcomes: Not Met   RN  Treatment Plan for Primary Diagnosis: <principal problem not specified> Long Term Goal(s): Knowledge of disease and therapeutic regimen to maintain health will improve  Short Term Goals: Ability to remain free from injury will improve, Ability to participate in decision making will improve, Ability to verbalize feelings will improve, Ability to disclose and discuss suicidal ideas, and Ability to identify and develop effective coping behaviors will improve  Medication Management: RN will administer medications as ordered by provider, will assess and evaluate patient's response and provide education to patient for prescribed medication. RN will report any adverse and/or side effects to prescribing provider.  Therapeutic Interventions: 1 on 1 counseling sessions, Psychoeducation, Medication administration, Evaluate responses to treatment, Monitor vital signs and CBGs as ordered, Perform/monitor CIWA, COWS, AIMS and Fall Risk screenings as ordered, Perform wound care treatments as ordered.  Evaluation of Outcomes: Not Met   LCSW Treatment Plan for Primary Diagnosis: <principal problem not specified> Long Term Goal(s): Safe transition to appropriate next level of care at discharge, Engage patient in therapeutic group addressing interpersonal concerns.  Short Term Goals: Engage patient in aftercare planning with referrals and resources, Increase social support, Increase emotional regulation, Facilitate acceptance of mental health diagnosis and concerns, Identify triggers associated with mental health/substance abuse issues, and Increase skills for wellness and recovery  Therapeutic Interventions: Assess for all discharge needs, 1 to 1 time with Social worker, Explore available resources and support systems, Assess for adequacy in community support network, Educate family and significant other(s) on suicide prevention, Complete Psychosocial Assessment, Interpersonal group therapy.  Evaluation of  Outcomes: Not Met   Progress in Treatment: Attending groups: Yes. Participating in groups: Yes. Taking medication as prescribed: Yes. Toleration medication: Yes. Family/Significant other contact made: Yes, individual(s) contacted:  If patient provides consent Patient understands diagnosis: No. Discussing patient identified problems/goals with staff: Yes. Medical problems stabilized or resolved: Yes. Denies suicidal/homicidal ideation: Yes. Issues/concerns per patient self-inventory: No.   New problem(s) identified: No, Describe:  None   New Short Term/Long Term Goal(s): medication stabilization, elimination of SI thoughts, development of comprehensive mental wellness plan.   Patient Goals: "To improve my thinking"  Discharge Plan or Barriers: Patient recently admitted. CSW will continue to follow and assess for appropriate referrals and possible discharge planning.   Reason for Continuation of Hospitalization: Hallucinations Medication stabilization Other; describe Paranoia  Estimated Length of Stay: 3 to 7 days   Last 3 [place] Suicide Severity Risk Score: [place] Admission (Current) from 01/08/2022 in [place] 500B ED from 01/07/2022 in [place] No Risk No Risk       Last PHQ 2/9 Scores:     No data to display          Scribe for Treatment Team: Carney Harder 01/09/2022 11:27 AM

## 2022-01-09 NOTE — Progress Notes (Signed)
   01/09/22 0604  15 Minute Checks  Location Bedroom  Visual Appearance Calm  Behavior Composed  Sleep (Behavioral Health Patients Only)  Calculate sleep? (Click Yes once per 24 hr at 0600 safety check) Yes  Documented sleep last 24 hours 3.5

## 2022-01-09 NOTE — Progress Notes (Signed)
   01/09/22 2100  Psych Admission Type (Psych Patients Only)  Admission Status Involuntary  Psychosocial Assessment  Patient Complaints Anxiety  Eye Contact Fair  Facial Expression Anxious  Affect Anxious  Speech Logical/coherent  Interaction Assertive  Motor Activity Slow  Appearance/Hygiene Unremarkable  Behavior Characteristics Cooperative  Mood Anxious;Pleasant  Aggressive Behavior  Effect No apparent injury  Thought Process  Coherency Circumstantial  Content WDL  Delusions None reported or observed  Perception Hallucinations  Hallucination Auditory;Visual  Judgment Impaired  Confusion None  Danger to Self  Current suicidal ideation? Denies  Danger to Others  Danger to Others None reported or observed
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
## 2022-01-09 NOTE — Progress Notes (Signed)
Recreation Therapy Notes  INPATIENT RECREATION THERAPY ASSESSMENT  Patient Details Name: Brent [name] MRN: [id number] DOB: 1980-12-28 Today's Date: 01/09/2022       Information Obtained From: Patient  Able to Participate in Assessment/Interview: Yes  Patient Presentation: Alert  Reason for Admission (Per Patient): Other (Comments) ("not sleeping")  Patient Stressors: Work, Death (dealing with the death of his mother)  Coping Skills:   Film/video editor, Exercise  Leisure Interests (2+):  Sports - Basketball, Sports - Other (Comment), Games - Dietitian, Music - Listen (Soccer, Track, Cigarettes)  Frequency of Recreation/Participation: Other (Comment) (Cigarettes-Daily; Soccer/Track during the Spring; [employer], Music- Doesn't have time because of work)  Biochemist, clinical Resources:  Yes  Community Resources:  Park, Public affairs consultant  Current Use: Yes  If no, Barriers?:    Expressed Interest in [employer] Information: No  [employer] of Residence:  Engineer, technical sales  Patient Main Form of Transportation: Set designer  Patient Strengths:  Compete with brothers  Patient Identified Areas of Improvement:  Handwriting  Patient Goal for Hospitalization:  "to relax my mind, get sleep and improve appetite"  Current SI (including self-harm):  No  Current HI:  No  Current AVH: No  Staff Intervention Plan: Group Attendance, Collaborate with Interdisciplinary Treatment Team  Consent to Intern Participation: N/A   Larue Lightner-McCall, LRT,CTRS Lindamarie Maclachlan A Marykathryn Carboni-McCall 01/09/2022, 2:21 PM

## 2022-01-09 NOTE — Progress Notes (Signed)
Adult Psychoeducational Group Note  Date:  01/09/2022 Time:  8:43 PM  Group Topic/Focus:  Wrap-Up Group:   The focus of this group is to help patients review their daily goal of treatment and discuss progress on daily workbooks.  Participation Level:  Active  Participation Quality:  Appropriate  Affect:  Appropriate  Cognitive:  Appropriate  Insight: Appropriate  Engagement in Group:  Engaged  Modes of Intervention:  Discussion, Limit-setting, and Rapport Building  Additional Comments:   Pt attended and participated in the Wrap Up group. Pt denied SI/HI/AVH and pain. Pt reports making progress to improving sleep quality and quantity. "I been sleeping better and I'm ready to go home soon". Pt shared that he missed his family and identified family and friends as his support system which helps him make it "with life". Pt disclosed that he was following the rules, taking his medication and getting along with everyone. Pt had an overall good day.  Brent [name] 01/09/2022, 8:43 PM

## 2022-01-09 NOTE — H&P (Addendum)
Psychiatric Admission Assessment Adult  Patient Identification: Brent [name] MRN:  [id number] Date of Evaluation:  01/09/2022  Chief Complaint:  Schizoaffective disorder (HCC)  Principal Problem:   Schizoaffective disorder (HCC) Active Problems:   PTSD (post-traumatic stress disorder)   History of Present Illness:  Brent [name] is a 41 y.o., male with a past psychiatric history significant for schizophrenia and PTSD who [facility] behavioral health urgent care [facility] for evaluation and management of psychosis in the setting of medication noncompliance from Abilify Maintenna.   Initial assessment on 01/09/2022, patient was evaluated on the inpatient unit, the patient reports not sleeping well for the past 2 to 3 weeks which has caused him to have worsening symptoms of depression and paranoia.  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  He reports that he is unable to destress during the week which includes running and playing soccer which worsened his mood.  He reports that the winter season is especially hard for him because he is not able to enjoy his hobbies outside.  He feels that since he has had lack of sleep and job stress causes him to not think clearly.  He reports having consistent low mood, anhedonia, feelings of hopelessness, low energy, poor concentration and appetite, and psychomotor retardation.  He feels his anxiety is at an 8/10.  He reports having more flashbacks of the war in [place] that he experience when he was living there prior to 1997.  He reports witnessing people dead on the side of the road.  He reports feeling hypervigilant from that time.  And avoiding [place] due to the trauma of war.  He denies AVH.  He feels that people are stealing from him at times.  Denies ideas of reference and thought broadcasting.  Denies HI.  Denies symptoms of mania including elevated mood, elevated energy  in the context of decreased sleep.  He reports previously being on an injection (per chart review was Abilify Maintena) but reports not being on it for the past 2 months.  He reports at first it was helping him but he believes it is like smoking which he did not want to build a dependence on that medication so he self stopped.  He also was on trazodone for sleep but he felt like it did not help him and is agreeable to taking melatonin in the hospital.  He does not recall other medications that he is on but he does recall previously taking medication for his mental health.   Chart review: [facility], patient has been diagnosed with Schizophrenia in the past, and is on Abilify SRER 400 mgr IM Q Month. [facility]. Per ED note, He denies SI, HI, AVH.  He reports racing thoughts and decreased need for sleep but denies manic episodes.  He had been on Abilify maintainna in the past but has not been taking any medications for last 6 months.  Per family, He was mentating well without medication until recently when his insomnia, depression and paranoia got worse for last few weeks.     He reports that he had psychiatrist in the past but he does not remember his name.   Sleep over past 24 hours: poor Appetite over past 24 hours:fair  Psychiatric medications prior to admission: Abilify, cogentin, trazodone per chart review  Try to obtain collateral information through patient's wife but did not answer.   Past  Psychiatric History:  Previous Psych Diagnoses: schizophrenia, PTSD Prior inpatient psychiatric treatment: abilify, cogentin, atarax, trazodone Current/prior outpatient psychiatric treatment: bilify, cogentin, trazodone Current psychiatrist: Recently saw psychiatrist about 1 month ago.  He cannot recall the name but says the clinic is located on [address].  Previous hospitalizations 3x from 2017-2018, reports that due to depression  Psychiatric medication compliance history:  poor  Current therapist: Denies, says his therapy is praying to God  History of suicide attempts: Denies  Substance Use History: Alcohol: Denies  --------  Tobacco: Denies Marijuana: Denies Cocaine: Denies Methamphetamines: Denies MDMA: Denies Ecstasy: Denies Opiates: Denies Benzodiazepines: Denies IV drug use: Denies Prescribed Meds abuse: Denies  History of Detox / Rehab: Denies  Is the patient at risk to self? Yes Has the patient been a risk to self in the past 6 months? Yes Has the patient been a risk to self within the distant past? Yes Is the patient a risk to others? No Has the patient been a risk to others in the past 6 months? No Has the patient been a risk to others within the distant past? No  Alcohol Screening: 1. How often do you have a drink containing alcohol?: Never 2. How many drinks containing alcohol do you have on a typical day when you are drinking?: 1 or 2 3. How often do you have six or more drinks on one occasion?: Never AUDIT-C Score: 0 4. How often during the last year have you found that you were not able to stop drinking once you had started?: Never 5. How often during the last year have you failed to do what was normally expected from you because of drinking?: Never 6. How often during the last year have you needed a first drink in the morning to get yourself going after a heavy drinking session?: Never 7. How often during the last year have you had a feeling of guilt of remorse after drinking?: Never 8. How often during the last year have you been unable to remember what happened the night before because you had been drinking?: Never 9. Have you or someone else been injured as a result of your drinking?: No 10. Has a relative or friend or a doctor or another health worker been concerned about your drinking or suggested you cut down?: No Alcohol Use Disorder Identification Test Final Score (AUDIT): 0 Tobacco Screening:    Substance Abuse  History in the last 12 months: No  Past Medical/Surgical History:  Medical Diagnoses: Denies Home Rx: Denies Prior Surgeries / non-head trauma: Denies  Head trauma: Denies LOC: Denies Concussions: In sports Seizures: Denies  Allergies: Haldol- Patient experienced acute dystonia after receiving 10 mg dose of haldol.  Family History:  Medical: Diabetes, seizures, MI Psych: Denies Suicide: Denies Substance use family hx: Denies  Social History:  Place of birth and grew up where: [place] and moved to [place] Abuse: Denies Marital Status: Married for 12 years Children: Denies Employment: Works with picture frames Education: [employer] school Housing: [place], lives with wife and wife's sister Finances: Fair Armed forces operational officer: Denies Hotel manager: Denies Weapons: Denies Pills stockpile: Denies  Lab Results:  Results for orders placed or performed during the hospital encounter of 01/08/22 (from the past 48 hour(s))  Rapid urine drug screen (hospital performed) not at [facility]     Status: Abnormal   Collection Time: 01/08/22  2:17 PM  Result Value Ref Range   Opiates NONE DETECTED NONE DETECTED   Cocaine NONE DETECTED NONE DETECTED  Benzodiazepines POSITIVE (A) NONE DETECTED   Amphetamines NONE DETECTED NONE DETECTED   Tetrahydrocannabinol NONE DETECTED NONE DETECTED   Barbiturates NONE DETECTED NONE DETECTED    Comment: (NOTE) DRUG SCREEN FOR MEDICAL PURPOSES ONLY.  IF CONFIRMATION IS NEEDED FOR ANY PURPOSE, NOTIFY LAB WITHIN 5 DAYS.  LOWEST DETECTABLE LIMITS FOR URINE DRUG SCREEN Drug Class                     Cutoff (ng/mL) Amphetamine and metabolites    1000 Barbiturate and metabolites    200 Benzodiazepine                 200 Opiates and metabolites        300 Cocaine and metabolites        300 THC                            50 Performed at [facility], [address] [place], [place] [address]     Blood Alcohol level:  Lab Results  Component  Value Date   [facility] <10 01/07/2022   ETH <5 08/19/2016    Metabolic Disorder Labs:  Lab Results  Component Value Date   HGBA1C 5.3 01/07/2022   MPG 105 01/07/2022   MPG 79.58 08/23/2016   Lab Results  Component Value Date   PROLACTIN 17.0 (H) 08/23/2016   PROLACTIN 17.0 (H) 02/02/2016   Lab Results  Component Value Date   CHOL 146 01/07/2022   TRIG 143 01/07/2022   HDL 35 (L) 01/07/2022   CHOLHDL 4.2 01/07/2022   VLDL 29 01/07/2022   LDLCALC 82 01/07/2022   LDLCALC 93 08/23/2016    Current Medications: Current Facility-Administered Medications  Medication Dose Route Frequency Provider Last Rate Last Admin   acetaminophen (TYLENOL) tablet 650 mg  650 mg Oral Q6H PRN Carrion-Carrero, Margely, MD       alum & mag hydroxide-simeth (MAALOX/MYLANTA) 200-200-20 MG/5ML suspension 30 mL  30 mL Oral Q4H PRN Carrion-Carrero, Margely, MD       ARIPiprazole (ABILIFY) tablet 15 mg  15 mg Oral Daily Carrion-Carrero, Margely, MD   15 mg at 01/09/22 0825   hydrOXYzine (ATARAX) tablet 25 mg  25 mg Oral TID PRN Lorri Frederick, MD   25 mg at 01/08/22 2034   OLANZapine zydis (ZYPREXA) disintegrating tablet 5 mg  5 mg Oral Q8H PRN Carrion-Carrero, Margely, MD       And   LORazepam (ATIVAN) tablet 1 mg  1 mg Oral PRN Carrion-Carrero, Margely, MD       And   ziprasidone (GEODON) injection 20 mg  20 mg Intramuscular PRN Carrion-Carrero, Margely, MD       magnesium hydroxide (MILK OF MAGNESIA) suspension 30 mL  30 mL Oral Daily PRN Carrion-Carrero, Margely, MD       melatonin tablet 3 mg  3 mg Oral QHS PRN Lance Muss, MD        PTA Medications: Medications Prior to Admission  Medication Sig Dispense Refill Last Dose   ARIPiprazole (ABILIFY) 5 MG tablet Take 1 tablet (5 mg total) by mouth daily. 10 tablet 0    benztropine (COGENTIN) 1 MG tablet Take 0.5 tablets (0.5 mg total) by mouth daily. 10 tablet 0    traZODone (DESYREL) 50 MG tablet Take 1 tablet (50 mg total) by mouth at  bedtime as needed for sleep. 10 tablet 0      Sleep:Sleep: Fair  Physical Findings: AIMS: No  CIWA:    COWS:     Mental Status Exam  General Appearance: appears at stated age, casually dressed and groomed   Behavior: pleasant and cooperative, slight suspicious regarding his phone  Psychomotor Activity: no psychomotor agitation or retardation noted   Eye Contact: good  Speech: normal amount, tone, volume and fluency    Mood: dysphoric  Affect: congruent  Thought Process: no circumstantial or tangential thought process noted, no racing thoughts or flight of ideas  Descriptions of Associations: intact  Thought Content Hallucinations: denies AH, VH , does not appear responding to stimuli  Delusions: Paranoia present. No delusions of control, grandeur, ideas of reference, thought broadcasting, and magical thinking   Suicidal Thoughts: denies SI, intention, plan  Homicidal Thoughts: denies HI, intention, plan   Alertness/Orientation: alert and fully oriented   Insight: limited Judgment: limited  Memory: limited  Executive Functions  Concentration: limited Attention Span: limited Recall: limited Fund of Knowledge: limited  Sleep  Sleep: Sleep: Fair    Nutritional Assessment (For OBS and FBC admissions only) Has the patient recently lost weight without trying?: 0 Has the patient been eating poorly because of a decreased appetite?: 0 Malnutrition Screening Tool Score: 0   Physical Exam  Constitutional:      Appearance: Normal appearance.  Cardiovascular:     Rate and Rhythm: Normal rate.  Pulmonary:     Effort: Pulmonary effort is normal.  Neurological:     General: No focal deficit present.     Mental Status: Alert and oriented to person, place, and time.    Review of Systems  Constitutional: Negative.  Negative for chills, fever and weight loss.  HENT: Negative.    Eyes: Negative.   Respiratory: Negative.    Cardiovascular: Negative.    Gastrointestinal:  Negative for constipation, diarrhea, nausea and vomiting.  Genitourinary: Negative.   Musculoskeletal: Negative.   Skin: Negative.   Neurological: Negative.  Negative for tingling.     Blood pressure 130/86, pulse 93, temperature 98.3 F (36.8 C), temperature source Oral, resp. rate 18, SpO2 100 %. There is no height or weight on file to calculate BMI.   Assets  Assets:Social Support; Physical Health; Transportation   Treatment Plan Summary: Daily contact with patient to assess and evaluate symptoms and progress in treatment and medication management  ASSESSMENT: Brent [name] is a 41 y.o., male with a past psychiatric history significant for schizophrenia and PTSD who presents to the [facility] In[facility]untary from behavioral health urgent care [facility]) for evaluation and management of psychosis in the setting of medication noncompliance from Abilify Maintenna.   PLAN: Safety and Monitoring:  -- Involuntary admission to inpatient psychiatric unit for safety, stabilization and treatment  -- Daily contact with patient to assess and evaluate symptoms and progress in treatment  -- Patient's case to be discussed in multi-disciplinary team meeting  -- Observation Level : q15 minute checks  -- Vital signs:  q12 hours  -- Precautions: suicide, elopement, and assault  2. Medications:    Psychiatric Diagnosis and Treatment Schizoaffective disorder, depressive type PTSD -Start Abilify 15 mg with plan to titrate up for paranoia -Agitation protocol: zyprexa, ativan, geodon  As needed medications  Tylenol 650 mg every 6 hours as needed for pain Mylanta 30 mL every 4 hours as needed for indigestion Atarax 25 mg every 6 hours as needed for anxiety Milk of magnesia 30 mL daily as needed for constipation Melatonin 3 mg nightly PRN for insomnia  The risks/benefits/side-effects/alternatives to the above medication were discussed in detail with the  patient and time was given for questions. The patient consents to medication trial. FDA black box warnings, if present, were discussed.  The patient is agreeable with the medication plan, as above. We will monitor the patient's response to pharmacologic treatment, and adjust medications as necessary.  3. Routine and other pertinent labs: EKG monitoring: QTc: 420 WBC 10.8 Gluc 108   Metabolism / endocrine: BMI: There is no height or weight on file to calculate BMI.  Lipid Panel: Lab Results  Component Value Date   CHOL 146 01/07/2022   TRIG 143 01/07/2022   HDL 35 (L) 01/07/2022   CHOLHDL 4.2 01/07/2022   VLDL 29 01/07/2022   LDLCALC 82 01/07/2022   LDLCALC 93 08/23/2016   HbgA1c: Hgb A1c MFr Bld (%)  Date Value  01/07/2022 5.3   TSH: TSH (uIU/mL)  Date Value  01/07/2022 1.384    Drugs of Abuse     Component Value Date/Time   LABOPIA NONE DETECTED 01/08/2022 1417   COCAINSCRNUR NONE DETECTED 01/08/2022 1417   LABBENZ POSITIVE (A) 01/08/2022 1417   AMPHETMU NONE DETECTED 01/08/2022 1417   THCU NONE DETECTED 01/08/2022 1417   LABBARB NONE DETECTED 01/08/2022 1417     4. Group Therapy:  -- Encouraged patient to participate in unit milieu and in scheduled group therapies   -- Short Term Goals: Ability to identify changes in lifestyle to reduce recurrence of condition will improve, Ability to verbalize feelings will improve, Ability to disclose and discuss suicidal ideas, Ability to demonstrate self-control will improve, Ability to identify and develop effective coping behaviors will improve, Ability to maintain clinical measurements within normal limits will improve, Compliance with prescribed medications will improve, and Ability to identify triggers associated with substance abuse/mental health issues will improve  -- Long Term Goals: Improvement in symptoms so as ready for discharge -- Patient is encouraged to participate in group therapy while admitted to the  psychiatric unit. -- We will address other chronic and acute stressors, which contributed to the patient's Schizoaffective disorder (HCC) in order to reduce the risk of self-harm at discharge.  5. Discharge Planning:   -- Social work and case management to assist with discharge planning and identification of hospital follow-up needs prior to discharge  -- Estimated LOS: 5-7 days  -- Discharge Concerns: Need to establish a safety plan; Medication compliance and effectiveness  -- Discharge Goals: Return home with outpatient referrals for mental health follow-up including medication management/psychotherapy  I certify that inpatient services furnished can reasonably be expected to improve the patient's condition.    I discussed my assessment, planned testing and intervention for the patient with Dr. Abbott Pao who agrees with my formulated course of action.   Lance Muss, MD, PGY-1 12/27/20231:22 PM

## 2022-01-09 NOTE — [facility] Group Notes (Signed)
Adult Psychoeducational Group Note  Date:  01/09/2022 Time:  9:33 AM  Group Topic/Focus:  Goals Group:   The focus of this group is to help patients establish daily goals to achieve during treatment and discuss how the patient can incorporate goal setting into their daily lives to aide in recovery.  Participation Level:  Active  Participation Quality:  Appropriate  Affect:  Appropriate  Cognitive:  Appropriate  Insight: Appropriate  Engagement in Group:  Engaged  Modes of Intervention:  Discussion   Brent [name] 01/09/2022, 9:33 AM

## 2022-01-09 NOTE — Group Note (Signed)
Recreation Therapy Group Note   Group Topic:Self-Esteem  Group Date: 01/09/2022 Start Time: 1000 End Time: 1015 Facilitators: Jaceyon Strole-McCall, LRT,CTRS Location: [address] Dayroom   Goal Area(s) Addresses:  Patient will successfully identify positive attributes about themselves.  Patient will identify healthy ways to increase self-esteem. Patient will acknowledge benefit(s) of improved self-esteem.    Group Description:  Finding Peace.  Due to the acuity of the patients, group was short.  LRT and patients discussed the importance of being able to find your peace.  Patients identified what peace ment to them.  Patients were then given a worksheet in which they were to identify their 5 steps to finding peace.    Affect/Mood: N/A   Participation Level: Did not attend    Clinical Observations/Individualized Feedback:     Plan: Continue to engage patient in RT group sessions 2-3x/week.   Brent [name], LRT,CTRS 01/09/2022 2:03 PM

## 2022-01-09 NOTE — Group Note (Signed)
[facility] LCSW Group Therapy Note   Group Date: 01/09/2022 Start Time: 1300 End Time: 1400   Type of Therapy/Topic:  Group Therapy:  Balance in Life  Participation Level:  Active   Description of Group:    This group will address the concept of balance and how it feels and looks when one is unbalanced. Patients will be encouraged to process areas in their lives that are out of balance, and identify reasons for remaining unbalanced. Facilitators will guide patients utilizing problem- solving interventions to address and correct the stressor making their life unbalanced. Understanding and applying boundaries will be explored and addressed for obtaining  and maintaining a balanced life. Patients will be encouraged to explore ways to assertively make their unbalanced needs known to significant others in their lives, using other group members and facilitator for support and feedback.  Therapeutic Goals: Patient will identify two or more emotions or situations they have that consume much of in their lives. Patient will identify signs/triggers that life has become out of balance:  Patient will identify two ways to set boundaries in order to achieve balance in their lives:  Patient will demonstrate ability to communicate their needs through discussion and/or role plays  Summary of Patient Progress: Patient came to group and accepted the provided worksheet. Patient introduced himself and explained what it meant to him about having Balance in Life. Patient provided positive insight and was respectful. Patient provided good examples and why he leaned more towards a certain balance than the other. Patient remained in the group the entire time.  Therapeutic Modalities:   Cognitive Behavioral Therapy Solution-Focused Therapy Assertiveness Training   Isabella Bowens, LCSWA

## 2022-01-10 DIAGNOSIS — F200 Paranoid schizophrenia: Secondary | ICD-10-CM

## 2022-01-10 MED ORDER — MELATONIN 5 MG PO TABS
5.0000 mg | ORAL_TABLET | Freq: Every evening | ORAL | Status: DC | PRN
  Administered 2022-01-10: 5 mg via ORAL
  Filled 2022-01-10: qty 1

## 2022-01-10 MED ORDER — ARIPIPRAZOLE 10 MG PO TABS
20.0000 mg | ORAL_TABLET | Freq: Every day | ORAL | Status: DC
  Administered 2022-01-11 – 2022-01-14 (×4): 20 mg via ORAL
  Filled 2022-01-10 (×6): qty 2

## 2022-01-10 NOTE — [facility] Suicide Risk Assessment (Deleted)
[facility] INPATIENT:  Family/Significant Other Suicide Prevention Education  Suicide Prevention Education:  Education Completed; Brent [name], boyfriend, [phone]   (name of family member/significant other) has been identified by the patient as the family member/significant other with whom the patient will be residing, and identified as the person(s) who will aid the patient in the event of a mental health crisis (suicidal ideations/suicide attempt).  With written consent from the patient, the family member/significant other has been provided the following suicide prevention education, prior to the and/or following the discharge of the patient.  No access to guns/weapons. Patietn has been hallucinating and saying things that didn't happen.  Patient not getting any sleep and has ongoing worry.  Patient will be primarily supported by her mother and father, Brent [name] and Brent [name].  Boyfriend, Mother or Father will pick her up. Patient unable to go to appointments because she has been hospitalized.   The suicide prevention education provided includes the following: Suicide risk factors Suicide prevention and interventions National Suicide Hotline telephone number [facility] assessment telephone number [facility] Emergency Assistance 911 [facility] and/or Residential Mobile Crisis Unit telephone number  Request made of family/significant other to: Remove weapons (e.g., guns, rifles, knives), all items previously/currently identified as safety concern.   Remove drugs/medications (over-the-counter, prescriptions, illicit drugs), all items previously/currently identified as a safety concern.  The family member/significant other verbalizes understanding of the suicide prevention education information provided.  The family member/significant other agrees to remove the items of safety concern listed above.  Brent [name] 01/10/2022, 2:07 PM

## 2022-01-10 NOTE — Progress Notes (Signed)
   01/10/22 0554  15 Minute Checks  Location Bedroom  Visual Appearance Calm  Behavior Composed  Sleep (Behavioral Health Patients Only)  Calculate sleep? (Click Yes once per 24 hr at 0600 safety check) Yes  Documented sleep last 24 hours 5.75

## 2022-01-10 NOTE — Progress Notes (Signed)
Adult Psychoeducational Group Note  Date:  01/10/2022 Time:  8:49 PM  Group Topic/Focus:  Wrap-Up Group:   The focus of this group is to help patients review their daily goal of treatment and discuss progress on daily workbooks.  Participation Level:  Did Not Attend  Participation Quality:   Did Not Attend  Affect:   Did Not Attend  Cognitive:   Did Not Attend  Insight: None  Engagement in Group:   Did Not Attend  Modes of Intervention:   Did Not Attend  Additional Comments:  Pt was encouraged to attend wrap up group but did not attend.  Felipa Furnace 01/10/2022, 8:49 PM

## 2022-01-10 NOTE — Progress Notes (Signed)
Brent [name] Progress Note  01/10/2022 9:28 AM Brent [name]  MRN:  [id number]   Reason for Admission:  Brent [name] Brent [name] is a 41 y.o. male with a history of  schizophrenia and PTSD, who was initially admitted for inpatient psychiatric hospitalization on 01/08/2022 for management of psychosis in the setting of medication noncompliance from Abilify Maintenna. The patient is currently on Hospital Day 2.   Chart Review from last 24 hours:  The patient's chart was reviewed and nursing notes were reviewed. The patient's case was discussed in multidisciplinary team meeting. Per [facility], patient was taking medications appropriately. Per nursing, patient is calm and cooperative and attended 2 groups. Patient received the following PRN medications: atarax 1x, melatonin  Information Obtained Today During Patient Interview: The patient was seen and evaluated on the unit. On assessment today the patient reports feeling better this morning. He spoke with his wife yesterday and notes the conversation was fine. He reports improving feelings of depression. He notes sleeping about 4-5 hours but still feels this is an improvement from home. He is agreeable to taking his medications. He understands the effects of being off his medications. Denies SI, HI, and AVH. He reports that he had paranoia four days ago, describing it feeling like people were out to hurt him. He now feels those feelings are much improved and no longer feels that way. He reports that the winter season is also difficult for him. I encouraged the patient to participate in therapy, especially in the winter times, and continue taking his medications and he was open-minded about the recommendation.    Principal Problem: Schizoaffective disorder (HCC) Diagnosis: Principal Problem:   Schizoaffective disorder (HCC) Active Problems:   PTSD (post-traumatic stress disorder)    Past Psychiatric History:  Previous Psych Diagnoses: schizophrenia,  PTSD Prior inpatient psychiatric treatment: abilify, cogentin, atarax, trazodone Current/prior outpatient psychiatric treatment: bilify, cogentin, trazodone Current psychiatrist: Recently saw psychiatrist about 1 month ago.  He cannot recall the name but says the clinic is located on [address]   Previous hospitalizations 3x from 2017-2018, reports that due to depression   Psychiatric medication compliance history: poor   Current therapist: Denies, says his therapy is praying to God   History of suicide attempts: Denies  Past Medical History:  Past Medical History:  Diagnosis Date   PTSD (post-traumatic stress disorder)    Schizophrenia (HCC)    Sleep disorder, unspecified    History reviewed. No pertinent surgical history. 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 Weapons: Denies Pills stockpile: Denies   Current Medications: Current Facility-Administered Medications  Medication Dose Route Frequency Provider Last Rate Last Admin   acetaminophen (TYLENOL) tablet 650 mg  650 mg Oral Q6H PRN Carrion-Carrero, Margely, [name]       alum & mag hydroxide-simeth (MAALOX/MYLANTA) 200-200-20 MG/5ML suspension 30 mL  30 mL Oral Q4H PRN Carrion-Carrero, Margely, [name]       [START ON 01/11/2022] ARIPiprazole (ABILIFY) tablet 20 mg  20 mg Oral Daily Lance MussHoang, Ledarius Leeson B, [name]  hydrOXYzine (ATARAX) tablet 25 mg  25 mg Oral TID PRN Lorri Frederick, [name]   25 mg at 01/09/22 2034   OLANZapine zydis (ZYPREXA)  disintegrating tablet 5 mg  5 mg Oral Q8H PRN Carrion-Carrero, Margely, [name]       And   LORazepam (ATIVAN) tablet 1 mg  1 mg Oral PRN Carrion-Carrero, Margely, [name]       And   ziprasidone (GEODON) injection 20 mg  20 mg Intramuscular PRN Carrion-Carrero, Margely, [name]       magnesium hydroxide (MILK OF MAGNESIA) suspension 30 mL  30 mL Oral Daily PRN Carrion-Carrero, Margely, [name]       melatonin tablet 5 mg  5 mg Oral QHS PRN Lance Muss, [name]        Lab Results:  Results for orders placed or performed during the hospital encounter of 01/08/22 (from the past 48 hour(s))  Rapid urine drug screen (hospital performed) not at [facility]     Status: Abnormal   Collection Time: 01/08/22  2:17 PM  Result Value Ref Range   Opiates NONE DETECTED NONE DETECTED   Cocaine NONE DETECTED NONE DETECTED   Benzodiazepines POSITIVE (A) NONE DETECTED   Amphetamines NONE DETECTED NONE DETECTED   Tetrahydrocannabinol NONE DETECTED NONE DETECTED   Barbiturates NONE DETECTED NONE DETECTED    Comment: (NOTE) DRUG SCREEN FOR MEDICAL PURPOSES ONLY.  IF CONFIRMATION IS NEEDED FOR ANY PURPOSE, NOTIFY LAB WITHIN 5 DAYS.  LOWEST DETECTABLE LIMITS FOR URINE DRUG SCREEN Drug Class                     Cutoff (ng/mL) Amphetamine and metabolites    1000 Barbiturate and metabolites    200 Benzodiazepine                 200 Opiates and metabolites        300 Cocaine and metabolites        300 THC                            50 Performed at [facility], [address] [place], [place] [address]     Blood Alcohol level:  Lab Results  Component Value Date   [facility] <10 01/07/2022   ETH <5 08/19/2016    Metabolic Disorder Labs: Lab Results  Component Value Date   HGBA1C 5.3 01/07/2022   MPG 105 01/07/2022   MPG 79.58 08/23/2016   Lab Results  Component Value Date   PROLACTIN 17.0 (H) 08/23/2016   PROLACTIN 17.0 (H) 02/02/2016   Lab Results  Component Value Date   CHOL 146 01/07/2022    TRIG 143 01/07/2022   HDL 35 (L) 01/07/2022   CHOLHDL 4.2 01/07/2022   VLDL 29 01/07/2022   LDLCALC 82 01/07/2022   LDLCALC 93 08/23/2016    Physical Findings: AIMS: Facial and Oral Movements Muscles of Facial Expression: None, normal Lips and Perioral Area: None, normal Jaw: None, normal Tongue: None, normal,Extremity Movements Upper (arms, wrists, hands, fingers): None, normal Lower (legs, knees, ankles, toes): None, normal, Trunk Movements Neck, shoulders, hips: None, normal, Overall Severity Severity of abnormal movements (highest score from questions above): None, normal Incapacitation due to abnormal movements: None, normal Patient's awareness of abnormal movements (rate only patient's report): No Awareness, Dental Status Current problems with teeth and/or dentures?: No Does patient usually wear dentures?: No  CIWA:    COWS:     Musculoskeletal:  Strength & Muscle Tone: within normal limits Gait & Station: normal Patient leans: N/A  Psychiatric Specialty Exam:  General Appearance: appears at stated age, casually dressed and groomed   Behavior: pleasant and cooperative  Psychomotor Activity: no psychomotor agitation or retardation noted   Eye Contact: fair Speech: normal amount, tone, volume and fluency   Mood: euthymic Affect: congruent, pleasant and interactive  Thought Process: linear, goal directed, no circumstantial or tangential thought process noted, no racing thoughts or flight of ideas Descriptions of Associations: intact Thought Content: no bizarre content, logical and future-oriented Hallucinations: denies AH, VH , does not appear responding to stimuli Delusions: no paranoia, delusions of control, grandeur, ideas of reference, thought broadcasting, and magical thinking Suicidal Thoughts: denies SI, intention, plan  Homicidal Thoughts: denies HI, intention, plan   Alertness/Orientation: alert and fully oriented  Insight: improving, able to look  back when his symptoms were worse and reflect Judgment: improving, understands the importance of taking his medication  Memory: intact  Executive Functions  Concentration: intact  Attention Span: fair Recall: intact Fund of Knowledge: fair    Assets  Social Support; Physical Health; Transportation    Physical Exam: Constitutional:      Appearance: Normal appearance.  Cardiovascular:     Rate and Rhythm: Normal rate.  Pulmonary:     Effort: Pulmonary effort is normal.  Neurological:     General: No focal deficit present.     Mental Status: Alert and oriented to person, place, and time.    Review of Systems  Constitutional: Negative.  Negative for chills, fever and weight loss.  HENT: Negative.    Eyes: Negative.   Respiratory: Negative.    Cardiovascular: Negative.   Gastrointestinal:  Negative for constipation, diarrhea, nausea and vomiting.  Genitourinary: Negative.   Musculoskeletal: Negative.   Skin: Negative.   Neurological: Negative.  Negative for tingling.    ASSESSMENT:  Diagnoses / Active Problems: Principal Problem: Schizoaffective disorder (HCC) Diagnosis: Principal Problem:   Schizoaffective disorder (HCC) Active Problems:   PTSD (post-traumatic stress disorder)   PLAN: Safety and Monitoring:  -- Involuntary admission to inpatient psychiatric unit for safety, stabilization and treatment  -- Daily contact with patient to assess and evaluate symptoms and progress in treatment  -- Patient's case to be discussed in multi-disciplinary team meeting  -- Observation Level : q15 minute checks  -- Vital signs:  q12 hours  -- Precautions: suicide, elopement, and assault  2. Medications:    Psychiatric Diagnosis and Treatment Schizoaffective disorder, depressive type PTSD -Increase Abilify to 20 mg with plan to titrate up for paranoia -Agitation protocol: zyprexa, ativan, geodon   As needed medications  Tylenol 650 mg every 6 hours as needed for  pain Mylanta 30 mL every 4 hours as needed for indigestion Atarax 25 mg every 6 hours as needed for anxiety Milk of magnesia 30 mL daily as needed for constipation Melatonin 5 mg nightly PRN for insomnia   The risks/benefits/side-effects/alternatives to the above medication were discussed in detail with the patient and time was given for questions. The patient consents to medication trial. FDA black box warnings, if present, were discussed.   The patient is agreeable with the medication plan, as above. We will monitor the patient's response to pharmacologic treatment, and adjust medications as necessary.    3. Pertinent labs:  EKG monitoring: QTc: 420 WBC 10.8 Gluc 108 Lipid panel WNL A1c 5.3 TSH WNL UDS positive benzo     Lab ordered: none   4.  Group Therapy:             -- Encouraged patient to participate in unit milieu and in scheduled group therapies              -- Short Term Goals: Ability to identify changes in lifestyle to reduce recurrence of condition will improve, Ability to verbalize feelings will improve, Ability to disclose and discuss suicidal ideas, Ability to demonstrate self-control will improve, Ability to identify and develop effective coping behaviors will improve, Ability to maintain clinical measurements within normal limits will improve, Compliance with prescribed medications will improve, and Ability to identify triggers associated with substance abuse/mental health issues will improve             -- Long Term Goals: Improvement in symptoms so as ready for discharge -- Patient is encouraged to participate in group therapy while admitted to the psychiatric unit. -- We will address other chronic and acute stressors, which contributed to the patient's Schizoaffective disorder (HCC) in order to reduce the risk of self-harm at discharge.   5. Discharge Planning:              -- Social work and case management to assist with discharge planning and identification of  hospital follow-up needs prior to discharge             -- Estimated LOS: 5-7 days             -- Discharge Concerns: Need to establish a safety plan; Medication compliance and effectiveness             -- Discharge Goals: Return home with outpatient referrals for mental health follow-up including medication management/psychotherapy      Total Time Spent in Direct Patient Care:  I personally spent 30 minutes on the unit in direct patient care. The direct patient care time included face-to-face time with the patient, reviewing the patient's chart, communicating with other professionals, and coordinating care. Greater than 50% of this time was spent in counseling or coordinating care with the patient regarding goals of hospitalization, psycho-education, and discharge planning needs.   Lance Muss, [name] PGY-1 Psychiatry 01/10/2022, 9:28 AM

## 2022-01-10 NOTE — Progress Notes (Addendum)
   01/10/22 1110  Psych Admission Type (Psych Patients Only)  Admission Status Involuntary  Psychosocial Assessment  Patient Complaints Anxiety  Eye Contact Fair  Facial Expression Anxious;Worried  Affect Appropriate to circumstance  Surveyor, minerals Activity Slow  Appearance/Hygiene Unremarkable  Behavior Characteristics Cooperative  Mood Anxious;Pleasant  Aggressive Behavior  Effect No apparent injury  Thought Process  Coherency Circumstantial  Content WDL  Delusions None reported or observed  Perception WDL  Hallucination None reported or observed  Judgment Impaired  Confusion None  Danger to Self  Current suicidal ideation? Denies  Agreement Not to Harm Self Yes  Description of Agreement Verbal contract  Danger to Others  Danger to Others None reported or observed   Pt A & O X3. Denies SI, HI, AVH, delusions and paranoia when assessed. Presents with fair eye contact, logical speech, ambulatory with steady gait. Exhibits insight into his mental illness as he was able to elaborate on his stressors and the importance of medication compliance. Per pt "When I first came, I used to look out the window, thinking people were after me but I'm fine now. Since I lost my mom 2 years ago plus my work schedule in the winter can be hard when I can't play soccer with my friends because of the weather, leaving in the dark and coming home in the dark too. I know now that I have to stay taking the medicines so I don't come back and be happy with my wife and my family". Pt attended scheduled groups. Support, encouragement and reassurance provided to pt. Safety maintained at Q 15 minutes intervals. Pt showered, tolerates meals, fluids and medications well without discomfort.

## 2022-01-10 NOTE — Group Note (Signed)
Recreation Therapy Group Note   Group Topic:Leisure Education  Group Date: 01/10/2022 Start Time: 1000 End Time: 1050 Facilitators: Herold Salguero-McCall, LRT,CTRS Location: [address] Dayroom   Goal Area(s) Addresses:  Patient will successfully identify positive leisure and recreation activities.  Patient will acknowlege benefits of participation in healthy leisure activities post discharge.    Group Description: What is Leisure?  LRT and patients discussed leisure (what it is and it's purpose).  LRT also discussed with patients how leisure can be used in different areas of their lives.  Patients were then give two worksheets.  One the first sheet, patients were to identify a leisure activity for each benefit described.  On the third sheet, patients were to identify three leisure activities for each category (ex. Improvement, pleasure, escape, fitness, socialization) provided they could do in their leisure time.  Patients would then share three answers from the first sheet and two categories from the second.   Affect/Mood: Appropriate   Participation Level: Engaged   Participation Quality: Independent   Behavior: Appropriate   Speech/Thought Process: Focused   Insight: Moderate   Judgement: Moderate   Modes of Intervention: Worksheet   Patient Response to Interventions:  Engaged   Education Outcome:  Acknowledges education and In group clarification offered    Clinical Observations/Individualized Feedback: Pt was appropriate and engaged.  Pt needed some assistance in understanding leisure and how to complete the sheets provided.  Pt explained leisure was going to the park.  Pt identified personal growth as giving effort and doing your homework before doing it.  To have better relationships, stated shopping with his wife and playing video games with friends and for belonging, identified shoes/house and putting effort into getting it.  Pt went on to saying he wants to spend  time with his family in the category of improvement and in socialization, identified soccer and video games with neighbors.     Plan: Continue to engage patient in RT group sessions 2-3x/week.   Laiden Milles-McCall, LRT,CTRS  01/10/2022 12:34 PM

## 2022-01-10 NOTE — Progress Notes (Signed)
Chaplain worked to engage in an initial visit with Brent [name] to honor the consult input for prayer.  Because [name] is Muslim, Chaplain understood that [name] would probably want a male Chaplain or an Imam.  Chaplain did work to provide support but [name] voiced that he knows how to pray and did not need any support at this time from [facility].    Chaplain is able to bring Quran if needed or provide prayer materials.  Chaplain can follow-up as needed.    01/10/22 1500  Clinical Encounter Type  Visited With Patient  Visit Type Initial;Spiritual support

## 2022-01-10 NOTE — Progress Notes (Signed)
   01/10/22 1945  Psych Admission Type (Psych Patients Only)  Admission Status Involuntary  Psychosocial Assessment  Patient Complaints Anxiety  Eye Contact Fair  Facial Expression Anxious  Affect Anxious  Speech Logical/coherent  Interaction Assertive  Motor Activity Slow  Appearance/Hygiene Unremarkable  Behavior Characteristics Cooperative  Mood Anxious;Pleasant  Aggressive Behavior  Effect No apparent injury  Thought Process  Coherency Circumstantial  Content WDL  Delusions None reported or observed  Perception Hallucinations  Hallucination Auditory;Visual  Judgment Impaired  Confusion None  Danger to Self  Current suicidal ideation? Denies  Danger to Others  Danger to Others None reported or observed

## 2022-01-11 MED ORDER — TRAZODONE HCL 50 MG PO TABS
50.0000 mg | ORAL_TABLET | Freq: Every evening | ORAL | Status: DC | PRN
  Administered 2022-01-11: 50 mg via ORAL
  Filled 2022-01-11: qty 1

## 2022-01-11 MED ORDER — MELATONIN 5 MG PO TABS
5.0000 mg | ORAL_TABLET | Freq: Every day | ORAL | Status: DC
  Administered 2022-01-11 – 2022-01-13 (×3): 5 mg via ORAL
  Filled 2022-01-11 (×5): qty 1

## 2022-01-11 NOTE — [facility] Suicide Risk Assessment (Signed)
[facility] INPATIENT:  Family/Significant Other Suicide Prevention Education  Suicide Prevention Education:  Contact Attempts: [name] (wife) [phone], (name of family member/significant other) has been identified by the patient as the family member/significant other with whom the patient will be residing, and identified as the person(s) who will aid the patient in the event of a mental health crisis.  With written consent from the patient, two attempts were made to provide suicide prevention education, prior to and/or following the patient's discharge.  We were unsuccessful in providing suicide prevention education.  A suicide education pamphlet was given to the patient to share with family/significant other.  Date and time of first attempt:12/28 at 1:45pm Date and time of second attempt:  12/29 at 2:08pm  Brent [name] [name] 01/11/2022, 2:18 PM

## 2022-01-11 NOTE — Progress Notes (Signed)
Patient did not attend morning orientation/goal setting group 

## 2022-01-11 NOTE — Progress Notes (Signed)
Pt had hard time sleeping, pt requested something to help him sleep, pt stated his mind was racing. Pt given PRN Ativan per MAR to help with his anxiety

## 2022-01-11 NOTE — Group Note (Signed)
Recreation Therapy Group Note   Group Topic:Stress Management  Group Date: 01/11/2022 Start Time: 1000 End Time: 1025 Facilitators: Joniya Boberg-McCall, LRT,CTRS Location: [address] Dayroom   Goal Area(s) Addresses:  Patient will identify positive stress management techniques. Patient will identify benefits of using stress management post d/c.  Group Description:  Meditation.  LRT played a meditation that focused on being grateful for the person in your life who has been supportive or made them feel good in tough times.  Patients were to it back, focus on their breathing and listen to the meditation as it played to engage in the meditation.   Affect/Mood: N/A   Participation Level: Did not attend    Clinical Observations/Individualized Feedback:     Plan: Continue to engage patient in RT group sessions 2-3x/week.   Pantelis Elgersma-McCall, LRT,CTRS  01/11/2022 1:02 PM

## 2022-01-11 NOTE — Progress Notes (Signed)
[facility] MD Progress Note  01/11/2022 10:53 AM Brent [name]  MRN:  [id number]   Reason for Admission:  Brent [name] is a 41 y.o. male with a history of  schizophrenia and PTSD, who was initially admitted for inpatient psychiatric hospitalization on 01/08/2022 for management of psychosis in the setting of medication noncompliance from Abilify Maintenna. The patient is currently on Hospital Day 3.   Chart Review from last 24 hours:  The patient's chart was reviewed and nursing notes were reviewed. The patient's case was discussed in multidisciplinary team meeting. Per [facility], patient was taking medications appropriately. Per nursing, patient is calm and cooperative and attended 2 groups. Patient received the following PRN medications: atarax 1x, melatonin  Information Obtained Today During Patient Interview: The patient was seen and evaluated on the unit. On assessment today the patient reports feeling better this morning. He spoke with his wife again yesterday and notes the conversation was fine. He reports improving feelings of depression. He reports continued difficulty sleeping due to racing thoughts but those have subsided since yesterday. He is agreeable to taking his medications. He understands the effects of being off his medications. Denies SI, HI, and AVH. He reports paranoia has since improved. Encouraged patient to continue taking his medications and he was open-minded about the recommendation.    Principal Problem: Schizoaffective disorder (HCC) Diagnosis: Principal Problem:   Schizoaffective disorder (HCC) Active Problems:   PTSD (post-traumatic stress disorder)    Past Psychiatric History:  Previous Psych Diagnoses: schizophrenia, PTSD Prior inpatient psychiatric treatment: abilify, cogentin, atarax, trazodone Current/prior outpatient psychiatric treatment: bilify, cogentin, trazodone Current psychiatrist: Recently saw psychiatrist about 1 month ago.  He cannot recall the  name but says the clinic is located on [address]   Previous hospitalizations 3x from 2017-2018, reports that due to depression   Psychiatric medication compliance history: poor   Current therapist: Denies, says his therapy is praying to God   History of suicide attempts: Denies  Past Medical History:  Past Medical History:  Diagnosis Date   PTSD (post-traumatic stress disorder)    Schizophrenia (HCC)    Sleep disorder, unspecified    History reviewed. No pertinent surgical history. Family History:  Family History  Problem Relation Age of Onset   Mental illness Neg Hx    Family History:  Medical: Diabetes, seizures, MI Psych: Denies Suicide: Denies Substance use family hx: Denies   Social History:  Place of birth and grew up where: [place] and moved to [place] in [place] Abuse: Denies Marital Status: Married for 12 years Children: Denies Employment: Works with [employer] Education: [employer] school Housing: [place], lives with wife and wife's sister Finances: Nutritional therapist: Denies Hotel manager: Denies Weapons: Denies Pills stockpile: Denies   Current Medications: Current Facility-Administered Medications  Medication Dose Route Frequency Provider Last Rate Last Admin   acetaminophen (TYLENOL) tablet 650 mg  650 mg Oral Q6H PRN Carrion-Carrero, Margely, MD       alum & mag hydroxide-simeth (MAALOX/MYLANTA) 200-200-20 MG/5ML suspension 30 mL  30 mL Oral Q4H PRN Carrion-Carrero, Margely, MD       ARIPiprazole (ABILIFY) tablet 20 mg  20 mg Oral Daily Kizzie Ide B, MD   20 mg at 01/11/22 0753   hydrOXYzine (ATARAX) tablet 25 mg  25 mg Oral TID PRN Lorri Frederick, MD   25 mg at 01/10/22 2044   magnesium hydroxide (MILK OF MAGNESIA) suspension 30 mL  30 mL Oral Daily PRN Lorri Frederick, MD  melatonin tablet 5 mg  5 mg Oral QHS Park Pope, MD       traZODone (DESYREL) tablet 50 mg  50 mg Oral QHS PRN,MR X 1 Park Pope, MD        Lab Results:  No  results found for this or any previous visit (from the past 48 hour(s)).   Blood Alcohol level:  Lab Results  Component Value Date   ETH <10 01/07/2022   ETH <5 08/19/2016    Metabolic Disorder Labs: Lab Results  Component Value Date   HGBA1C 5.3 01/07/2022   MPG 105 01/07/2022   MPG 79.58 08/23/2016   Lab Results  Component Value Date   PROLACTIN 17.0 (H) 08/23/2016   PROLACTIN 17.0 (H) 02/02/2016   Lab Results  Component Value Date   CHOL 146 01/07/2022   TRIG 143 01/07/2022   HDL 35 (L) 01/07/2022   CHOLHDL 4.2 01/07/2022   VLDL 29 01/07/2022   LDLCALC 82 01/07/2022   LDLCALC 93 08/23/2016    Physical Findings: AIMS: Facial and Oral Movements Muscles of Facial Expression: None, normal Lips and Perioral Area: None, normal Jaw: None, normal Tongue: None, normal,Extremity Movements Upper (arms, wrists, hands, fingers): None, normal Lower (legs, knees, ankles, toes): None, normal, Trunk Movements Neck, shoulders, hips: None, normal, Overall Severity Severity of abnormal movements (highest score from questions above): None, normal Incapacitation due to abnormal movements: None, normal Patient's awareness of abnormal movements (rate only patient's report): No Awareness, Dental Status Current problems with teeth and/or dentures?: No Does patient usually wear dentures?: No  CIWA:    COWS:     Musculoskeletal: Strength & Muscle Tone: within normal limits Gait & Station: normal Patient leans: N/A  Psychiatric Specialty Exam:  General Appearance: appears at stated age, casually dressed and groomed   Behavior: pleasant and cooperative  Psychomotor Activity: no psychomotor agitation or retardation noted   Eye Contact: fair Speech: normal amount, tone, volume and fluency   Mood: euthymic Affect: congruent, pleasant and interactive  Thought Process: linear, goal directed, no circumstantial or tangential thought process noted, no racing thoughts or flight of  ideas Descriptions of Associations: intact Thought Content: no bizarre content, logical and future-oriented Hallucinations: denies AH, VH , does not appear responding to stimuli Delusions: no paranoia, delusions of control, grandeur, ideas of reference, thought broadcasting, and magical thinking Suicidal Thoughts: denies SI, intention, plan  Homicidal Thoughts: denies HI, intention, plan   Alertness/Orientation: alert and fully oriented  Insight: improving, able to look back when his symptoms were worse and reflect Judgment: improving, understands the importance of taking his medication  Memory: intact  Executive Functions  Concentration: intact  Attention Span: fair Recall: intact Fund of Knowledge: fair    Assets  Social Support; Physical Health; Transportation    Physical Exam: Constitutional:      Appearance: Normal appearance.  Cardiovascular:     Rate and Rhythm: Normal rate.  Pulmonary:     Effort: Pulmonary effort is normal.  Neurological:     General: No focal deficit present.     Mental Status: Alert and oriented to person, place, and time.    Review of Systems  Constitutional: Negative.  Negative for chills, fever and weight loss.  HENT: Negative.    Eyes: Negative.   Respiratory: Negative.    Cardiovascular: Negative.   Gastrointestinal:  Negative for constipation, diarrhea, nausea and vomiting.  Genitourinary: Negative.   Musculoskeletal: Negative.   Skin: Negative.   Neurological: Negative.  Negative for tingling.  ASSESSMENT:  Diagnoses / Active Problems: Principal Problem: Schizoaffective disorder (HCC) Diagnosis: Principal Problem:   Schizoaffective disorder (HCC) Active Problems:   PTSD (post-traumatic stress disorder)   PLAN: Safety and Monitoring:  -- Involuntary admission to inpatient psychiatric unit for safety, stabilization and treatment  -- Daily contact with patient to assess and evaluate symptoms and progress in  treatment  -- Patient's case to be discussed in multi-disciplinary team meeting  -- Observation Level : q15 minute checks  -- Vital signs:  q12 hours  -- Precautions: suicide, elopement, and assault  2. Medications:    Psychiatric Diagnosis and Treatment Schizoaffective disorder, depressive type PTSD -Continue Abilify to 20 mg with plan to titrate up for paranoia -Agitation protocol: haldol, cogentin, ativan -Schedule melatonin 5 mg qhs -Start trazodone 50 mg qhs prn and x1 more for insomnia   As needed medications  Tylenol 650 mg every 6 hours as needed for pain Mylanta 30 mL every 4 hours as needed for indigestion Atarax 25 mg every 6 hours as needed for anxiety Milk of magnesia 30 mL daily as needed for constipation   The risks/benefits/side-effects/alternatives to the above medication were discussed in detail with the patient and time was given for questions. The patient consents to medication trial. FDA black box warnings, if present, were discussed.   The patient is agreeable with the medication plan, as above. We will monitor the patient's response to pharmacologic treatment, and adjust medications as necessary.    3. Pertinent labs:  EKG monitoring: QTc: 420 WBC 10.8 Gluc 108 Lipid panel WNL A1c 5.3 TSH WNL UDS positive benzo     Lab ordered: none   4. Group Therapy:             -- Encouraged patient to participate in unit milieu and in scheduled group therapies              -- Short Term Goals: Ability to identify changes in lifestyle to reduce recurrence of condition will improve, Ability to verbalize feelings will improve, Ability to disclose and discuss suicidal ideas, Ability to demonstrate self-control will improve, Ability to identify and develop effective coping behaviors will improve, Ability to maintain clinical measurements within normal limits will improve, Compliance with prescribed medications will improve, and Ability to identify triggers associated with  substance abuse/mental health issues will improve             -- Long Term Goals: Improvement in symptoms so as ready for discharge -- Patient is encouraged to participate in group therapy while admitted to the psychiatric unit. -- We will address other chronic and acute stressors, which contributed to the patient's Schizoaffective disorder (HCC) in order to reduce the risk of self-harm at discharge.   5. Discharge Planning:              -- Social work and case management to assist with discharge planning and identification of hospital follow-up needs prior to discharge             -- Estimated LOS: 5-7 days             -- Discharge Concerns: Need to establish a safety plan; Medication compliance and effectiveness             -- Discharge Goals: Return home with outpatient referrals for mental health follow-up including medication management/psychotherapy      Total Time Spent in Direct Patient Care:  I personally spent 30 minutes on the unit in direct patient care. The  direct patient care time included face-to-face time with the patient, reviewing the patient's chart, communicating with other professionals, and coordinating care. Greater than 50% of this time was spent in counseling or coordinating care with the patient regarding goals of hospitalization, psycho-education, and discharge planning needs.   Park Pope, MD 01/11/2022, 10:53 AM

## 2022-01-11 NOTE — Group Note (Signed)
LCSW Group Therapy Note   Group Date: 01/11/2022 Start Time: 1100 End Time: 1200   Type of Therapy and Topic:  Group Therapy: Boundaries  Participation Level:  Did Not Attend  Description of Group: This group will address the use of boundaries in their personal lives. Patients will explore why boundaries are important, the difference between healthy and unhealthy boundaries, and negative and postive outcomes of different boundaries and will look at how boundaries can be crossed.  Patients will be encouraged to identify current boundaries in their own lives and identify what kind of boundary is being set. Facilitators will guide patients in utilizing problem-solving interventions to address and correct types boundaries being used and to address when no boundary is being used. Understanding and applying boundaries will be explored and addressed for obtaining and maintaining a balanced life. Patients will be encouraged to explore ways to assertively make their boundaries and needs known to significant others in their lives, using other group members and facilitator for role play, support, and feedback.  Therapeutic Goals:  1.  Patient will identify areas in their life where setting clear boundaries could be  used to improve their life.  2.  Patient will identify signs/triggers that a boundary is not being respected. 3.  Patient will identify two ways to set boundaries in order to achieve balance in  their lives: 4.  Patient will demonstrate ability to communicate their needs and set boundaries  through discussion and/or role plays  Summary of Patient Progress:  X  Therapeutic Modalities:   Cognitive Behavioral Therapy Solution-Focused Therapy  Gardiner Sleeper Chaos Carlile, LCSW 01/11/2022  1:52 PM

## 2022-01-11 NOTE — Progress Notes (Signed)
   01/11/22 1930  Psych Admission Type (Psych Patients Only)  Admission Status Involuntary  Psychosocial Assessment  Patient Complaints Anxiety  Eye Contact Fair  Facial Expression Anxious  Affect Anxious  Speech Logical/coherent  Interaction Assertive  Motor Activity Slow  Appearance/Hygiene Unremarkable  Behavior Characteristics Cooperative  Mood Anxious;Pleasant  Aggressive Behavior  Effect No apparent injury  Thought Process  Coherency Circumstantial  Content WDL  Delusions None reported or observed  Perception Hallucinations  Hallucination Auditory;Visual  Judgment Impaired  Confusion None  Danger to Self  Current suicidal ideation? Denies  Danger to Others  Danger to Others None reported or observed

## 2022-01-11 NOTE — Progress Notes (Signed)
Patient denies SI, HI, AVH. Patient reports difficulty sleeping last night due to racing thoughts and that medication helped. Patient is ambulatory, with fair eye contact, slow motor activity, logical speech, pleasant mood, and unremarkable appearance. Patient is cooperative. Support and encouragement provided. Safety maintained.

## 2022-01-11 NOTE — Progress Notes (Signed)
Adult Psychoeducational Group Note  Date:  01/11/2022 Time:  8:55 PM  Group Topic/Focus:  Wrap-Up Group:   The focus of this group is to help patients review their daily goal of treatment and discuss progress on daily workbooks.  Participation Level:  Active  Participation Quality:  Appropriate  Affect:  Appropriate  Cognitive:  Appropriate  Insight: Appropriate  Engagement in Group:  Engaged  Modes of Intervention:  Discussion  Additional Comments:  Pt states that he had a good day and enjoyed his visit from his brother in law. Pt denies everything and states that he would like to spend more time with his wife and father when he leaves  Vevelyn Pat 01/11/2022, 8:55 PM

## 2022-01-12 MED ORDER — TRAZODONE HCL 100 MG PO TABS
100.0000 mg | ORAL_TABLET | Freq: Every day | ORAL | Status: DC
  Administered 2022-01-12 – 2022-01-13 (×2): 100 mg via ORAL
  Filled 2022-01-12 (×4): qty 1

## 2022-01-12 NOTE — [facility] Group Notes (Signed)
Goals Group 01/12/22   Group Focus: affirmation, clarity of thought, and goals/reality orientation Treatment Modality:  Psychoeducation Interventions utilized were assignment, group exercise, and support Purpose: To be able to understand and verbalize the reason for their admission to the hospital. To understand that the medication helps with their chemical imbalance but they also need to work on their choices in life. To be challenged to develop [name] list of 30 positives about themselves. Also introduce the concept that "feelings" are not reality.  Participation Level: did not attend  Brent [name] 

## 2022-01-12 NOTE — Progress Notes (Signed)
[facility]  01/12/2022 10:41 AM Macarius Gildardo Poundshmed Buskey  MRN:  [id number]   Reason for Admission:  Brent [name] is a 41 y.o. male with a history of  schizophrenia and PTSD, who was initially admitted for inpatient psychiatric hospitalization on 01/08/2022 for management of psychosis in the setting of medication noncompliance from Abilify Maintenna. The patient is currently on Hospital Day 4.   Chart Review from last 24 hours:  The patient's chart was reviewed and nursing notes were reviewed. The patient's case was discussed in multidisciplinary team meeting. [facility], patient was taking medications appropriately. Per nursing, patient is calm and cooperative and attended 2 groups. Patient received the following PRN medications: atarax 1x, melatonin  Information Obtained Today During Patient Interview: Patient seen and assessed at bedside.  Reports sleep has continued to be a problem but denies any other complaints.  Reports appetite is appropriate.  Continues to deny SI/HI/AVH.  Continues to deny paranoia today.  He states that he is willing to be started on LAI; however he does feel he needs to speak with wife first to ensure that she also is agreeable to this plan he would like for LAI Abilify Maintena to be started tomorrow instead of today.   Principal Problem: Schizoaffective disorder (HCC) Diagnosis: Principal Problem:   Schizoaffective disorder (HCC) Active Problems:   PTSD (post-traumatic stress disorder)    Past Psychiatric History:  Previous Psych Diagnoses: schizophrenia, PTSD Prior inpatient psychiatric treatment: abilify, cogentin, atarax, trazodone Current/prior outpatient psychiatric treatment: bilify, cogentin, trazodone Current psychiatrist: Recently saw psychiatrist about 1 month ago.  He cannot recall the name but says the clinic is located on [address]   Previous hospitalizations 3x from 2017-2018, reports that due to depression   Psychiatric medication  compliance history: poor   Current therapist: Denies, says his therapy is praying to God   History of suicide attempts: Denies  Past Medical History:  Past Medical History:  Diagnosis Date   PTSD (post-traumatic stress disorder)    Schizophrenia (HCC)    Sleep disorder, unspecified    History reviewed. No pertinent surgical history. 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 Weapons: Denies Pills stockpile: Denies   Current Medications: Current Facility-Administered Medications  Medication Dose Route Frequency Provider Last Rate Last Admin   acetaminophen (TYLENOL) tablet 650 mg  650 mg Oral Q6H PRN Carrion-Carrero, Margely, MD       alum & mag hydroxide-simeth (MAALOX/MYLANTA) 200-200-20 MG/5ML suspension 30 mL  30 mL Oral Q4H PRN Carrion-Carrero, Margely, MD       ARIPiprazole (ABILIFY) tablet 20 mg  20 mg Oral Daily Kizzie IdeHoang, Daniela B, MD   20 mg at 01/12/22 0802   hydrOXYzine (ATARAX) tablet 25 mg  25 mg Oral TID PRN Lorri Frederickarrion-Carrero, Margely, MD   25 mg at 01/11/22 2033   magnesium hydroxide (MILK OF MAGNESIA) suspension 30 mL  30 mL Oral Daily PRN Carrion-Carrero, Karle StarchMargely, MD       melatonin tablet 5 mg  5 mg Oral QHS Park PopeJi, Thamas Appleyard,  MD   5 mg at 01/11/22 2032   traZODone (DESYREL) tablet 100 mg  100 mg Oral Seabron Spates, MD        Lab Results:  No results found for this or any previous visit (from the past 48 hour(s)).   Blood Alcohol level:  Lab Results  Component Value Date   ETH <10  01/07/2022   ETH <5 08/19/2016    Metabolic Disorder Labs: Lab Results  Component Value Date   HGBA1C 5.3 01/07/2022   MPG 105 01/07/2022   MPG 79.58 08/23/2016   Lab Results  Component Value Date   PROLACTIN 17.0 (H) 08/23/2016   PROLACTIN 17.0 (H) 02/02/2016   Lab Results  Component Value Date   CHOL 146 01/07/2022   TRIG 143 01/07/2022   HDL 35 (L) 01/07/2022   CHOLHDL 4.2 01/07/2022   VLDL 29 01/07/2022   LDLCALC 82 01/07/2022   LDLCALC 93 08/23/2016    Physical Findings: AIMS: Facial and Oral Movements Muscles of Facial Expression: None, normal Lips and Perioral Area: None, normal Jaw: None, normal Tongue: None, normal,Extremity Movements Upper (arms, wrists, hands, fingers): None, normal Lower (legs, knees, ankles, toes): None, normal, Trunk Movements Neck, shoulders, hips: None, normal, Overall Severity Severity of abnormal movements (highest score from questions above): None, normal Incapacitation due to abnormal movements: None, normal Patient's awareness of abnormal movements (rate only patient's report): No Awareness, Dental Status Current problems with teeth and/or dentures?: No Does patient usually wear dentures?: No  CIWA:    COWS:     Musculoskeletal: Strength & Muscle Tone: within normal limits Gait & Station: normal Patient leans: N/A  Psychiatric Specialty Exam:  General Appearance: appears at stated age, casually dressed and groomed   Behavior: pleasant and cooperative  Psychomotor Activity: no psychomotor agitation or retardation noted   Eye Contact: fair Speech: normal amount, tone, volume and fluency   Mood: euthymic Affect: congruent, pleasant and interactive  Thought Process: linear, goal directed, no circumstantial or tangential thought process noted, no racing thoughts or flight of ideas Descriptions of Associations: intact Thought Content: no bizarre content, logical and future-oriented Hallucinations: denies AH, VH , does  not appear responding to stimuli Delusions: no paranoia, delusions of control, grandeur, ideas of reference, thought broadcasting, and magical thinking Suicidal Thoughts: denies SI, intention, plan  Homicidal Thoughts: denies HI, intention, plan   Alertness/Orientation: alert and fully oriented  Insight: improving, able to look back when his symptoms were worse and reflect Judgment: improving, understands the importance of taking his medication  Memory: intact  Executive Functions  Concentration: intact  Attention Span: fair Recall: intact Fund of Knowledge: fair    Assets  Social Support; Physical Health; Transportation    Physical Exam: Constitutional:      Appearance: Normal appearance.  Cardiovascular:     Rate and Rhythm: Normal rate.  Pulmonary:     Effort: Pulmonary effort is normal.  Neurological:     General: No focal deficit present.     Mental Status: Alert and oriented to person, place, and time.    Review of Systems  Constitutional: Negative.  Negative for chills, fever and weight loss.  HENT: Negative.    Eyes: Negative.   Respiratory: Negative.    Cardiovascular: Negative.   Gastrointestinal:  Negative for constipation, diarrhea, nausea and vomiting.  Genitourinary: Negative.   Musculoskeletal: Negative.   Skin: Negative.   Neurological: Negative.  Negative for tingling.    ASSESSMENT:  Diagnoses / Active Problems: Principal Problem:  Schizoaffective disorder (HCC) Diagnosis: Principal Problem:   Schizoaffective disorder (HCC) Active Problems:   PTSD (post-traumatic stress disorder)   PLAN: Safety and Monitoring:  -- Involuntary admission to inpatient psychiatric unit for safety, stabilization and treatment  -- Daily contact with patient to assess and evaluate symptoms and progress in treatment  -- Patient's case to be discussed in multi-disciplinary team meeting  -- Observation Level : q15 minute checks  -- Vital signs:  q12 hours  --  Precautions: suicide, elopement, and assault  2. Medications:    Psychiatric Diagnosis and Treatment Schizoaffective disorder, depressive type PTSD -Continue Abilify 20 mg for paranoia -Agitation protocol: haldol, cogentin, ativan -Continue melatonin 5 mg qhs -Schedule trazodone 100 mg qhs  -Consider restoril tomorrow if still not sleeping well   As needed medications  Tylenol 650 mg every 6 hours as needed for pain Mylanta 30 mL every 4 hours as needed for indigestion Atarax 25 mg every 6 hours as needed for anxiety Milk of magnesia 30 mL daily as needed for constipation   The risks/benefits/side-effects/alternatives to the above medication were discussed in detail with the patient and time was given for questions. The patient consents to medication trial. FDA black box warnings, if present, were discussed.   The patient is agreeable with the medication plan, as above. We will monitor the patient's response to pharmacologic treatment, and adjust medications as necessary.    3. Pertinent labs:  EKG monitoring: QTc: 420 WBC 10.8 Gluc 108 Lipid panel WNL A1c 5.3 TSH WNL UDS positive benzo     Lab ordered: none   4. Group Therapy:             -- Encouraged patient to participate in unit milieu and in scheduled group therapies              -- Short Term Goals: Ability to identify changes in lifestyle to reduce recurrence of condition will improve, Ability to verbalize feelings will improve, Ability to disclose and discuss suicidal ideas, Ability to demonstrate self-control will improve, Ability to identify and develop effective coping behaviors will improve, Ability to maintain clinical measurements within normal limits will improve, Compliance with prescribed medications will improve, and Ability to identify triggers associated with substance abuse/mental health issues will improve             -- Long Term Goals: Improvement in symptoms so as ready for discharge -- Patient is  encouraged to participate in group therapy while admitted to the psychiatric unit. -- We will address other chronic and acute stressors, which contributed to the patient's Schizoaffective disorder (HCC) in order to reduce the risk of self-harm at discharge.   5. Discharge Planning:              -- Social work and case management to assist with discharge planning and identification of hospital follow-up needs prior to discharge             -- Estimated LOS: 5-7 days             -- Discharge Concerns: Need to establish a safety plan; Medication compliance and effectiveness             -- Discharge Goals: Return home with outpatient referrals for mental health follow-up including medication management/psychotherapy      Total Time Spent in Direct Patient Care:  I personally spent 30 minutes on the unit in direct patient care. The direct patient care time included face-to-face time with the patient, reviewing  the patient's chart, communicating with other professionals, and coordinating care. Greater than 50% of this time was spent in counseling or coordinating care with the patient regarding goals of hospitalization, psycho-education, and discharge planning needs.   Park Pope, MD 01/12/2022, 10:41 AM

## 2022-01-12 NOTE — Progress Notes (Signed)
Pt is A&OX4, calm, mood and affect are congruent. Pt appetite is ok. No complaints of anxiety, distress, pain and/or discomfort at this time. Pt's memory appears to be grossly intact, and Pt hasn't displayed any injurious behaviors. Pt is medication compliant. There's no evidence of suicidal intent. Psychomotor activity was WNL. No s/s of Parkinson, Dystonia, Akathisia and/or Tardive Dyskinesia noted.

## 2022-01-12 NOTE — Group Note (Signed)
[facility] LCSW Group Therapy Note  Date:  01/12/2022   Type of Therapy and Topic:  Group Therapy:  Focus for the New Year  Participation Level:  Did Not Attend   Description of Group:  The focus of this group was to provide patients with an opportunity to think about and discuss what they can work on this coming year that will result in them being happier and healthier one year from today.  It was reviewed how "new year's resolutions" often fade in importance within a few days, so patients were encouraged to think in a broader, more impactful way about what they wish to focus on to change their lives.  Therapeutic Goals Patients discussed in general the benefit of having goals to work on Patients described their own personal goals/focus for the next year that will enable them to be happier and healthier Patients received encouragement from each other and CSW Patients provided support and ideas to each other  Summary of Patient Progress: N/A   Therapeutic Modalities Processing   Brent Manago Grossman-Orr, LCSW    

## 2022-01-12 NOTE — Progress Notes (Signed)
   01/12/22 2100  Psych Admission Type (Psych Patients Only)  Admission Status Involuntary  Psychosocial Assessment  Patient Complaints Worrying  Eye Contact Fair  Facial Expression Anxious  Affect Appropriate to circumstance  Speech Logical/coherent  Interaction Assertive  Motor Activity Slow  Appearance/Hygiene Unremarkable  Behavior Characteristics Cooperative;Appropriate to situation  Mood Pleasant  Thought Process  Coherency Circumstantial  Content WDL  Delusions None reported or observed  Perception WDL  Hallucination None reported or observed  Judgment WDL  Confusion None  Danger to Self  Current suicidal ideation? Denies  Danger to Others  Danger to Others None reported or observed

## 2022-01-12 NOTE — Progress Notes (Addendum)
   01/12/22 1112  Psych Admission Type (Psych Patients Only)  Admission Status Involuntary  Psychosocial Assessment  Patient Complaints Anxiety  Eye Contact Fair  Facial Expression Animated;Anxious  Affect Appropriate to circumstance  Speech Logical/coherent  Interaction Assertive  Motor Activity Slow;Pacing  Appearance/Hygiene Unremarkable  Behavior Characteristics Cooperative  Mood Anxious;Pleasant  Aggressive Behavior  Effect No apparent injury  Thought Process  Coherency Circumstantial  Content WDL  Delusions None reported or observed  Perception WDL  Hallucination None reported or observed  Judgment WDL  Confusion None  Danger to Self  Current suicidal ideation? Denies  Agreement Not to Harm Self Yes  Description of Agreement Verbal contract  Danger to Others  Danger to Others None reported or observed   Pt presents with fair eye contact, logical speech, ambulatory with steady gait and is pleasant on interactions. Denies SI, HI, AVH, delusions, paranoia and pain. Reports poor sleep last night "I woke up and couldn't sleep no more. They gave me some medicines" but appetite is good. Pt remains medication compliant without adverse drug reactions. Safety checks maintained at Q 15 minutes intervals without incident. Assigned provider made aware, Trazodone order increased to 100 mg PO starting at bedtime. Support, encouragement and reassurance provided to pt. He tolerates meals and fluids well. Pt did not attended scheduled groups thus far despite multiple prompts. Off unit for meals, returned without issues.

## 2022-01-12 NOTE — Progress Notes (Signed)
Patient did not attend morning orientation group.  

## 2022-01-12 NOTE — Progress Notes (Signed)
   01/12/22 0557  15 Minute Checks  Location Dayroom  Visual Appearance Calm  Behavior Composed  Sleep (Behavioral Health Patients Only)  Calculate sleep? (Click Yes once per 24 hr at 0600 safety check) Yes  Documented sleep last 24 hours 2.75

## 2022-01-13 MED ORDER — ARIPIPRAZOLE ER 400 MG IM SRER
400.0000 mg | INTRAMUSCULAR | Status: DC
  Filled 2022-01-13: qty 2

## 2022-01-13 MED ORDER — ARIPIPRAZOLE ER 400 MG IM SRER
400.0000 mg | INTRAMUSCULAR | Status: DC
  Administered 2022-01-13: 400 mg via INTRAMUSCULAR
  Filled 2022-01-13: qty 2

## 2022-01-13 NOTE — Plan of Care (Signed)
  Problem: Education: Goal: Emotional status will improve Outcome: Progressing Goal: Mental status will improve Outcome: Progressing   Problem: Activity: Goal: Sleeping patterns will improve Outcome: Progressing   Problem: Coping: Goal: Ability to verbalize frustrations and anger appropriately will improve Outcome: Progressing   Problem: Health Behavior/Discharge Planning: Goal: Compliance with treatment plan for underlying cause of condition will improve Outcome: Progressing   Problem: Safety: Goal: Periods of time without injury will increase Outcome: Progressing

## 2022-01-13 NOTE — Plan of Care (Signed)
  Problem: Education: Goal: Emotional status will improve Outcome: Progressing Goal: Mental status will improve Outcome: Progressing   Problem: Health Behavior/Discharge Planning: Goal: Compliance with treatment plan for underlying cause of condition will improve Outcome: Progressing   

## 2022-01-13 NOTE — Progress Notes (Signed)
   01/13/22 0615  15 Minute Checks  Location Bedroom  Visual Appearance Calm  Behavior Composed  Sleep (Behavioral Health Patients Only)  Calculate sleep? (Click Yes once per 24 hr at 0600 safety check) Yes  Documented sleep last 24 hours 7.25

## 2022-01-13 NOTE — Progress Notes (Signed)
[facility]  01/13/2022 11:02 AM Norman Gildardo Poundshmed Fulginiti  MRN:  [id number]   Reason for Admission:  Brent [name] is a 41 y.o. male with a history of  schizophrenia and PTSD, who was initially admitted for inpatient psychiatric hospitalization on 01/08/2022 for management of psychosis in the setting of medication noncompliance from Abilify Maintenna. The patient is currently on Hospital Day 5.   Chart Review from last 24 hours:  The patient's chart was reviewed and nursing notes were reviewed. The patient's case was discussed in multidisciplinary team meeting. [facility] was [facility] medications appropriately. Per nursing, patient is calm and cooperative and attended 2 groups. Patient received the following PRN medications: atarax 1x, melatonin  Information Obtained Today During Patient Interview: Patient seen and assessed at bedside.  Reports sleeping better with increased trazodone dose but states still not great. Reports appetite is appropriate.  Continues to deny SI/HI/AVH.  Continues to deny paranoia today.  He states that he is willing to be started on LAI today. He states he used to get LAI from clinic [address]. He has no acute questions at this time.   AIMS:0, no cogwheeling rigidity  Principal Problem: Schizoaffective disorder (HCC) Diagnosis: Principal Problem:   Schizoaffective disorder (HCC) Active Problems:   PTSD (post-traumatic stress disorder)    Past Psychiatric History:  Previous Psych Diagnoses: schizophrenia, PTSD Prior inpatient psychiatric treatment: abilify, cogentin, atarax, trazodone Current/prior outpatient psychiatric treatment: bilify, cogentin, trazodone Current psychiatrist: Recently saw psychiatrist about 1 month ago.  He cannot recall the name but says the clinic is located on [address]   Previous hospitalizations 3x from 2017-2018, reports that due to depression   Psychiatric medication compliance history: poor   Current  therapist: Denies, says his therapy is praying to God   History of suicide attempts: Denies  Past Medical History:  Past Medical History:  Diagnosis Date   PTSD (post-traumatic stress disorder)    Schizophrenia (HCC)    Sleep disorder, unspecified    History reviewed. No pertinent surgical history. 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 Weapons: Denies Pills stockpile: Denies   Current Medications: Current Facility-Administered Medications  Medication Dose Route Frequency Provider Last Rate Last Admin   acetaminophen (TYLENOL) tablet 650 mg  650 mg Oral Q6H PRN Carrion-Carrero, Margely, MD       alum & mag hydroxide-simeth (MAALOX/MYLANTA) 200-200-20 MG/5ML suspension 30 mL  30 mL Oral Q4H PRN Carrion-Carrero, Margely, MD       ARIPiprazole (ABILIFY) tablet 20 mg  20 mg Oral Daily Kizzie IdeHoang, Daniela B, MD   20 mg at 01/13/22 0808   ARIPiprazole ER (ABILIFY MAINTENA) injection 400 mg  400 mg Intramuscular Q28 days Park PopeJi, Tajanay Hurley, MD       hydrOXYzine (ATARAX) tablet 25 mg  25 mg Oral TID PRN Lorri Frederickarrion-Carrero, Margely, MD   25 mg at 01/12/22 2056   magnesium hydroxide (MILK OF MAGNESIA) suspension 30 mL  30 mL Oral Daily PRN Lorri Frederickarrion-Carrero, Margely, MD  melatonin tablet 5 mg  5 mg Oral QHS Park Pope, MD   5 mg at 01/12/22 2057   traZODone (DESYREL) tablet 100 mg  100 mg Oral Seabron Spates, MD   100 mg at 01/12/22 2057    Lab Results:  No results found for this or any previous visit (from the past  48 hour(s)).   Blood Alcohol level:  Lab Results  Component Value Date   ETH <10 01/07/2022   ETH <5 08/19/2016    Metabolic Disorder Labs: Lab Results  Component Value Date   HGBA1C 5.3 01/07/2022   MPG 105 01/07/2022   MPG 79.58 08/23/2016   Lab Results  Component Value Date   PROLACTIN 17.0 (H) 08/23/2016   PROLACTIN 17.0 (H) 02/02/2016   Lab Results  Component Value Date   CHOL 146 01/07/2022   TRIG 143 01/07/2022   HDL 35 (L) 01/07/2022   CHOLHDL 4.2 01/07/2022   VLDL 29 01/07/2022   LDLCALC 82 01/07/2022   LDLCALC 93 08/23/2016    Physical Findings: AIMS: Facial and Oral Movements Muscles of Facial Expression: None, normal Lips and Perioral Area: None, normal Jaw: None, normal Tongue: None, normal,Extremity Movements Upper (arms, wrists, hands, fingers): None, normal Lower (legs, knees, ankles, toes): None, normal, Trunk Movements Neck, shoulders, hips: None, normal, Overall Severity Severity of abnormal movements (highest score from questions above): None, normal Incapacitation due to abnormal movements: None, normal Patient's awareness of abnormal movements (rate only patient's report): No Awareness, Dental Status Current problems with teeth and/or dentures?: No Does patient usually wear dentures?: No  CIWA:    COWS:     Musculoskeletal: Strength & Muscle Tone: within normal limits Gait & Station: normal Patient leans: N/A  Psychiatric Specialty Exam:  General Appearance: appears at stated age, casually dressed and groomed   Behavior: pleasant and cooperative  Psychomotor Activity: no psychomotor agitation or retardation noted   Eye Contact: fair Speech: normal amount, tone, volume and fluency   Mood: euthymic Affect: congruent, pleasant and interactive  Thought Process: linear, goal directed, no circumstantial or tangential thought process noted, no racing thoughts or flight of ideas Descriptions of Associations: intact Thought Content:  no bizarre content, logical and future-oriented Hallucinations: denies AH, VH , does not appear responding to stimuli Delusions: no paranoia, delusions of control, grandeur, ideas of reference, thought broadcasting, and magical thinking Suicidal Thoughts: denies SI, intention, plan  Homicidal Thoughts: denies HI, intention, plan   Alertness/Orientation: alert and fully oriented  Insight: improving, able to look back when his symptoms were worse and reflect Judgment: improving, understands the importance of taking his medication  Memory: intact  Executive Functions  Concentration: intact  Attention Span: fair Recall: intact Fund of Knowledge: fair    Assets  Social Support; Physical Health; Transportation    Physical Exam: Constitutional:      Appearance: Normal appearance.  Cardiovascular:     Rate and Rhythm: Normal rate.  Pulmonary:     Effort: Pulmonary effort is normal.  Neurological:     General: No focal deficit present.     Mental Status: Alert and oriented to person, place, and time.    Review of Systems  Constitutional: Negative.  Negative for chills, fever and weight loss.  HENT: Negative.    Eyes: Negative.   Respiratory: Negative.    Cardiovascular: Negative.   Gastrointestinal:  Negative for constipation, diarrhea, nausea and vomiting.  Genitourinary: Negative.   Musculoskeletal: Negative.   Skin: Negative.   Neurological: Negative.  Negative for tingling.    ASSESSMENT:  Diagnoses / Active Problems: Principal Problem: Schizoaffective disorder (HCC) Diagnosis: Principal Problem:   Schizoaffective disorder (HCC) Active Problems:   PTSD (post-traumatic stress disorder)   PLAN: Safety and Monitoring:  -- Involuntary admission to inpatient psychiatric unit for safety, stabilization and treatment  -- Daily contact with patient to assess and evaluate symptoms and progress in treatment  -- Patient's case to be discussed in multi-disciplinary team  meeting  -- Observation Level : q15 minute checks  -- Vital signs:  q12 hours  -- Precautions: suicide, elopement, and assault  2. Medications:    Psychiatric Diagnosis and Treatment Schizoaffective disorder, depressive type PTSD -Continue Abilify 20 mg for paranoia (14 day bridge of Abilify)  -START Abilify Maintena 300 mg q28 days -Agitation protocol: haldol, cogentin, ativan -Continue melatonin 5 mg qhs -Continue trazodone 100 mg qhs   As needed medications  Tylenol 650 mg every 6 hours as needed for pain Mylanta 30 mL every 4 hours as needed for indigestion Atarax 25 mg every 6 hours as needed for anxiety Milk of magnesia 30 mL daily as needed for constipation   The risks/benefits/side-effects/alternatives to the above medication were discussed in detail with the patient and time was given for questions. The patient consents to medication trial. FDA black box warnings, if present, were discussed.   The patient is agreeable with the medication plan, as above. We will monitor the patient's response to pharmacologic treatment, and adjust medications as necessary.    3. Pertinent labs:  EKG monitoring: QTc: 420 WBC 10.8 Gluc 108 Lipid panel WNL A1c 5.3 TSH WNL UDS positive benzo     Lab ordered: none   4. Group Therapy:             -- Encouraged patient to participate in unit milieu and in scheduled group therapies              -- Short Term Goals: Ability to identify changes in lifestyle to reduce recurrence of condition will improve, Ability to verbalize feelings will improve, Ability to disclose and discuss suicidal ideas, Ability to demonstrate self-control will improve, Ability to identify and develop effective coping behaviors will improve, Ability to maintain clinical measurements within normal limits will improve, Compliance with prescribed medications will improve, and Ability to identify triggers associated with substance abuse/mental health issues will improve              -- Long Term Goals: Improvement in symptoms so as ready for discharge -- Patient is encouraged to participate in group therapy while admitted to the psychiatric unit. -- We will address other chronic and acute stressors, which contributed to the patient's Schizoaffective disorder (HCC) in order to reduce the risk of self-harm at discharge.   5. Discharge Planning:              -- Social work and case management to assist with discharge planning and identification of hospital follow-up needs prior to discharge             -- Estimated LOS: 5-7 days             -- Discharge Concerns: Need to establish a safety plan; Medication compliance and effectiveness             -- Discharge Goals: Return home with outpatient referrals for mental health follow-up including medication management/psychotherapy      Total Time Spent in Direct Patient Care:  I personally spent 30 minutes on  the unit in direct patient care. The direct patient care time included face-to-face time with the patient, reviewing the patient's chart, communicating with other professionals, and coordinating care. Greater than 50% of this time was spent in counseling or coordinating care with the patient regarding goals of hospitalization, psycho-education, and discharge planning needs.   Park Pope, MD 01/13/2022, 11:02 AM

## 2022-01-13 NOTE — [facility] Group Notes (Signed)
Adult Psychoeducational Group Note Date:  01/13/2022 Time:  0900-1000 Group Topic/Focus: PROGRESSIVE RELAXATION. A group where deep breathing is taught and tensing and relaxation muscle groups is used. Imagery is used as well.  Pts are asked to imagine 3 pillars that hold them up when they are not able to hold themselves up and to share that with the group.   Participation Level:  did not attend   : Daila Elbert A   

## 2022-01-13 NOTE — Progress Notes (Signed)
Adult Psychoeducational Group Note  Date:  01/13/2022 Time:  10:02 AM  Group Topic/Focus:  Goals Group:   The focus of this group is to help patients establish daily goals to achieve during treatment and discuss how the patient can incorporate goal setting into their daily lives to aide in recovery.  Participation Level:  Did Not Attend  Participation Quality:   n/a  Affect:   n/a  Cognitive:   n/a  Insight: None  Engagement in Group:   n/a  Modes of Intervention:   n/a  Additional Comments:   Patient did not attend the morning goals group.  Brent [name] 01/13/2022, 10:02 AM

## 2022-01-13 NOTE — Progress Notes (Signed)
D- Patient alert and oriented. Denies SI, HI, AVH, and pain. Patient reports, "I feel like myself." Patient states that he is feeling better after speaking with his wife and learning that everything is fine at home. Patient states that he spent most of his day today sleeping.   A- Scheduled medications administered to patient, per MAR. Support and encouragement provided.  Routine safety checks conducted every 15 minutes.  Patient informed to notify staff with problems or concerns.  R-No adverse drug reactions noted. Patient contracts for safety at this time. Patient compliant with medications and treatment plan. Patient receptive, calm, and cooperative. Patient interacts well with others on the unit.  Patient remains safe at this time.

## 2022-01-13 NOTE — Group Note (Signed)
  [facility] LCSW Group Therapy Note  Date/Time:  01/13/2022 10:00am-11:00am  Type of Therapy and Topic:  Group Therapy:  Stability after Discharge  Participation Level:  Did Not Attend   Description of Group This process group involved patients discussing what their overall goal is at this time in their life and how they plan to work toward that goal when they get home from the hospital.  The group started with patients sharing their goal and plans, then proceeded with group members identifying with each other.  A discussion ensued about the differences in healthy and unhealthy coping skills and a variety of specific coping skills that might be helpful in specific instances were shared.  Group members shared ideas about making changes when they return home so that they can stay well and in recovery.  These included boundaries, putting oneself first, staying on medications, talking to a therapist, and more  Therapeutic Goals Patient will identify one overall goal Patient will list current ideas for how to go about achieving their goal Patient will participate in generating ideas about healthy self-care options when they return to the community Patient will be supportive of one another and receive support from others Patient will receive affirmations and hope  Summary of Patient Progress:  N/A   Therapeutic Modalities Brief Solution-Focused Therapy Psychoeducation   Jaiah Weigel Grossman-Orr, LCSW 01/13/2022, 12:00pm    

## 2022-01-14 DIAGNOSIS — F251 Schizoaffective disorder, depressive type: Secondary | ICD-10-CM

## 2022-01-14 MED ORDER — TRAZODONE HCL 100 MG PO TABS: 100.0000 mg | ORAL_TABLET | Freq: Every day | ORAL | 0 refills | Status: AC

## 2022-01-14 MED ORDER — ARIPIPRAZOLE ER 400 MG IM SRER: 400.0000 mg | INTRAMUSCULAR | 0 refills | Status: AC

## 2022-01-14 MED ORDER — ARIPIPRAZOLE 20 MG PO TABS: 20.0000 mg | ORAL_TABLET | Freq: Every day | ORAL | 0 refills | Status: AC

## 2022-01-14 MED ORDER — MELATONIN 5 MG PO TABS: 5.0000 mg | ORAL_TABLET | Freq: Every day | ORAL | 0 refills | Status: AC

## 2022-01-14 NOTE — [facility] Suicide Risk Assessment (Signed)
[facility] Discharge Suicide Risk Assessment   Principal Problem: Schizoaffective disorder [facility]) Discharge Diagnoses: Principal Problem:   Schizoaffective disorder ([place]) Active Problems:   PTSD (post-traumatic stress disorder)   Reason for Admission: Brent [name] is a 42 y.o. male with a history of  schizophrenia and PTSD, who was initially admitted for inpatient psychiatric hospitalization on 01/08/2022 for management of psychosis in the setting of medication noncompliance from [place].   Hospital Summary During the patient's hospitalization, patient had extensive initial psychiatric evaluation, and follow-up psychiatric evaluations every day.  Psychiatric diagnoses provided upon initial assessment:  Schizoaffective disorder, depressive type PTSD  Patient's psychiatric medications were adjusted on admission:  -Start Abilify 15 mg for psychosis  During the hospitalization, other adjustments were made to the patient's psychiatric medication regimen:  -Increased Abilify to 20 mg daily x14 days for oral bridge -Gave Abilify maintena 400 mg on 01/13/22, should receive every 28 days  Gradually, patient started adjusting to milieu.   Patient's care was discussed during the interdisciplinary team meeting every day during the hospitalization.  The patient denies having side effects to prescribed psychiatric medication.  The patient reports their target psychiatric symptoms of psychosis responded well to the psychiatric medications, and the patient reports overall benefit other psychiatric hospitalization. Supportive psychotherapy was provided to the patient. The patient also participated in regular group therapy while admitted.   Labs were reviewed with the patient, and abnormal results were discussed with the patient.  The patient denied having suicidal thoughts more than 48 hours prior to discharge.  Patient denies having homicidal thoughts.  Patient denies having auditory  hallucinations.  Patient denies any visual hallucinations.  Patient denies having paranoid thoughts.  The patient is able to verbalize their individual safety plan to this provider.  It is recommended to the patient to continue psychiatric medications as prescribed, after discharge from the hospital.    It is recommended to the patient to follow up with your outpatient psychiatric provider and PCP.  Discussed with the patient, the impact of alcohol, drugs, tobacco have been there overall psychiatric and medical wellbeing, and total abstinence from substance use was recommended the patient.   Total Time spent with patient: 45 minutes  Musculoskeletal: Strength & Muscle Tone: within normal limits Gait & Station: normal Patient leans: N/A  Psychiatric Specialty Exam  Presentation  General Appearance: Fairly Groomed   Eye Contact:Fair   Speech:Clear and Coherent   Speech Volume:Normal   Handedness:Right    Mood and Affect  Mood:Anxious   Duration of Depression Symptoms: No data recorded  Affect:Flat    Thought Process  Thought Processes:Coherent   Descriptions of Associations:Intact   Orientation:Partial   Thought Content:Paranoid Ideation   History of Schizophrenia/Schizoaffective disorder:Yes   Duration of Psychotic Symptoms:Greater than six months   Hallucinations:No data recorded Ideas of Reference:Paranoia   Suicidal Thoughts:No data recorded Homicidal Thoughts:No data recorded  Sensorium  Memory:Immediate Poor; Recent Poor; Remote Poor   Judgment:Poor   Insight:Poor    Executive Functions  Concentration:Fair   Attention Span:Fair   Recall:Poor   Fund of Knowledge:Poor   Language:Poor    Psychomotor Activity  Psychomotor Activity:No data recorded  Assets  Assets:Social Support; Physical Health; Transportation    Sleep  Sleep:No data recorded  Physical Exam: Physical Exam ROS Blood pressure 129/75,  pulse 83, temperature 98 F (36.7 C), temperature source Oral, resp. rate 16, SpO2 100 %. There is no height or weight on file to calculate BMI.  Mental Status Per Nursing  Assessment::   On Admission:  Self-harm behaviors  Demographic Factors:  Male  Loss Factors: NA  Historical Factors: NA  Risk Reduction Factors:   Sense of responsibility to family, Employed, Living with another person, especially a relative, Positive social support, Positive therapeutic relationship, and Positive coping skills or problem solving skills  Continued Clinical Symptoms:  Severe Anxiety and/or Agitation Depression:   Insomnia More than one psychiatric diagnosis Previous Psychiatric Diagnoses and Treatments  Cognitive Features That Contribute To Risk:  None    Suicide Risk:  Mild:  Suicidal ideation of limited frequency, intensity, duration, and specificity.  There are no identifiable plans, no associated intent, mild dysphoria and related symptoms, good self-control (both objective and subjective assessment), few other risk factors, and identifiable protective factors, including available and accessible social support.   [facility]. Schedule an appointment as soon as possible for a visit.   Why: Please call this provider to schedule appointments for therapy and medication management services, as they were closed for holiday prior to your discharge. Contact information: [place] [address] [phone]                 Plan Of Care/Follow-up recommendations:  Activity: as tolerated  Diet: heart healthy  Other: -Follow-up with your outpatient psychiatric provider -instructions on appointment date, time, and address (location) are provided to you in discharge paperwork.  -Take your psychiatric medications as prescribed at discharge - instructions are provided to you in the discharge paperwork  -Testing: Follow-up  with outpatient provider for abnormal lab results: none  -Recommend abstinence from alcohol, tobacco, and other illicit drug use at discharge.   -If your psychiatric symptoms recur, worsen, or if you have side effects to your psychiatric medications, call your outpatient psychiatric provider, 911, [phone] or go to the nearest emergency department.  -If suicidal thoughts recur, call your outpatient psychiatric provider, 911, [phone] or go to the nearest emergency department.   France Ravens, MD 01/14/2022, 9:18 AM

## 2022-01-14 NOTE — [facility] Suicide Risk Assessment (Deleted)
Physician Discharge Summary Note  Patient:  Brent [name] is an 42 y.o., male MRN:  [id number] DOB:  [birth year] Patient phone:  [phone] (home)  Patient address:   [address] [place] [address],  Total Time spent with patient: 45 minutes  Date of Admission:  01/08/2022 Date of Discharge: 01/14/2022  Reason for Admission:   Brent [name] is a 42 y.o. male with a history of  schizophrenia and PTSD, who was initially admitted for inpatient psychiatric hospitalization on 01/08/2022 for management of psychosis in the setting of medication noncompliance from [place].    Principal Problem: Schizoaffective disorder [facility]) Discharge Diagnoses: Principal Problem:   Schizoaffective disorder ([place]) Active Problems:   PTSD (post-traumatic stress disorder)    Past Psychiatric History:  Previous Psych Diagnoses: schizophrenia, PTSD Prior inpatient psychiatric treatment: abilify, cogentin, atarax, trazodone Current/prior outpatient psychiatric treatment: bilify, cogentin, trazodone Current psychiatrist: Recently saw psychiatrist about 1 month ago.  He cannot recall the name but says the clinic is located on [address].   Previous hospitalizations 3x from 2017-2018, reports that due to depression   Psychiatric medication compliance history: poor   Current therapist: Denies, says his therapy is praying to God   History of suicide attempts: Denies  Past Medical History:  Past Medical History:  Diagnosis Date   PTSD (post-traumatic stress disorder)    Schizophrenia ([place])    Sleep disorder, unspecified    History reviewed. No pertinent surgical history. Family History:  Family History  Problem Relation Age of Onset   Mental illness Neg Hx    Family Psychiatric  History:  Medical: Diabetes, seizures, MI Psych: Denies Suicide: Denies Substance use family hx: Denies  Social History:  Social History   Substance and Sexual Activity  Alcohol Use No      Social History   Substance and Sexual Activity  Drug Use No    Social History   Socioeconomic History   Marital status: Single    Spouse name: Not on file   Number of children: Not on file   Years of education: Not on file   Highest education level: Not on file  Occupational History   Not on file  Tobacco Use   Smoking status: Never   Smokeless tobacco: Never  Substance and Sexual Activity   Alcohol use: No   Drug use: No   Sexual activity: Not on file  Other Topics Concern   Not on file  Social History Narrative   Not on file   Social Determinants of Health   Financial Resource Strain: Not on file  Food Insecurity: No Food Insecurity (01/08/2022)   Hunger Vital Sign    Worried About Running Out of Food in the Last Year: Never true    Ran Out of Food in the Last Year: Never true  Transportation Needs: No Transportation Needs (01/08/2022)   PRAPARE - Hydrologist (Medical): No    Lack of Transportation (Non-Medical): No  Physical Activity: Not on file  Stress: Not on file  Social Connections: Not on file    Hospital Course:   During the patient's hospitalization, patient had extensive initial psychiatric evaluation, and follow-up psychiatric evaluations every day.   Psychiatric diagnoses provided upon initial assessment:  Schizoaffective disorder, depressive type PTSD   Patient's psychiatric medications were adjusted on admission:  -Start Abilify 15 mg for psychosis   During the hospitalization, other adjustments were made to the patient's psychiatric medication regimen:  -Increased  Abilify to 20 mg daily x14 days for oral bridge -Gave Abilify maintena 400 mg on 01/13/22, should receive every 28 days   Gradually, patient started adjusting to milieu.   Patient's care was discussed during the interdisciplinary team meeting every day during the hospitalization.   The patient denies having side effects to prescribed psychiatric  medication.   The patient reports their target psychiatric symptoms of psychosis responded well to the psychiatric medications, and the patient reports overall benefit other psychiatric hospitalization. Supportive psychotherapy was provided to the patient. The patient also participated in regular group therapy while admitted.    Labs were reviewed with the patient, and abnormal results were discussed with the patient.   The patient denied having suicidal thoughts more than 48 hours prior to discharge.  Patient denies having homicidal thoughts.  Patient denies having auditory hallucinations.  Patient denies any visual hallucinations.  Patient denies having paranoid thoughts.   The patient is able to verbalize their individual safety plan to this provider.   It is recommended to the patient to continue psychiatric medications as prescribed, after discharge from the hospital.     It is recommended to the patient to follow up with your outpatient psychiatric provider and PCP.   Discussed with the patient, the impact of alcohol, drugs, tobacco have been there overall psychiatric and medical wellbeing, and total abstinence from substance use was recommended the patient.    Physical Findings: AIMS: 0  Musculoskeletal: Strength & Muscle Tone: within normal limits Gait & Station: normal Patient leans: N/A   Psychiatric Specialty Exam:  Presentation  General Appearance:  Appropriate for Environment; Casual   Eye Contact: Good   Speech: Clear and Coherent; Normal Rate   Speech Volume: Normal   Handedness: Right    Mood and Affect  Mood: Euthymic   Affect: Appropriate; Congruent    Thought Process  Thought Processes: Coherent; Goal Directed; Linear   Descriptions of Associations:Intact   Orientation:Full (Time, Place and Person)   Thought Content:Logical   History of Schizophrenia/Schizoaffective disorder:No   Duration of Psychotic Symptoms:Greater  than six months   Hallucinations:Hallucinations: None   Ideas of Reference:None   Suicidal Thoughts:Suicidal Thoughts: No   Homicidal Thoughts:Homicidal Thoughts: No    Sensorium  Memory: Immediate Good; Recent Good; Remote Good   Judgment: Fair   Insight: Fair    Art therapist  Concentration: Good   Attention Span: Good   Recall: Good   Fund of Knowledge: Good   Language: Good    Psychomotor Activity  Psychomotor Activity: Psychomotor Activity: Normal    Assets  Assets: Communication Skills; Desire for Improvement; Physical Health    Sleep  Sleep: Sleep: Good     Physical Exam: Physical Exam Vitals and nursing note reviewed.  Constitutional:      Appearance: Normal appearance. He is normal weight.  HENT:     Head: Normocephalic and atraumatic.  Pulmonary:     Effort: Pulmonary effort is normal.  Neurological:     General: No focal deficit present.     Mental Status: He is oriented to person, place, and time.    Review of Systems  Respiratory:  Negative for shortness of breath.   Cardiovascular:  Negative for chest pain.  Gastrointestinal:  Negative for abdominal pain, constipation, diarrhea, heartburn, nausea and vomiting.  Neurological:  Negative for headaches.   Blood pressure 129/75, pulse 83, temperature 98 F (36.7 C), temperature source Oral, resp. rate 16, SpO2 100 %. There is no height or  weight on file to calculate BMI.   Social History   Tobacco Use  Smoking Status Never  Smokeless Tobacco Never   Tobacco Cessation:  N/A, patient does not currently use tobacco products   Blood Alcohol level:  Lab Results  Component Value Date   ETH <10 01/07/2022   ETH <5 08/19/2016    Metabolic Disorder Labs:  Lab Results  Component Value Date   HGBA1C 5.3 01/07/2022   MPG 105 01/07/2022   MPG 79.58 08/23/2016   Lab Results  Component Value Date   PROLACTIN 17.0 (H) 08/23/2016   PROLACTIN 17.0  (H) 02/02/2016   Lab Results  Component Value Date   CHOL 146 01/07/2022   TRIG 143 01/07/2022   HDL 35 (L) 01/07/2022   CHOLHDL 4.2 01/07/2022   VLDL 29 01/07/2022   LDLCALC 82 01/07/2022   LDLCALC 93 08/23/2016    See Psychiatric Specialty Exam and Suicide Risk Assessment completed by Attending Physician prior to discharge.  Discharge destination:  Home  Is patient on multiple antipsychotic therapies at discharge:  No   Has Patient had three or more failed trials of antipsychotic monotherapy by history:  No  Recommended Plan for Multiple Antipsychotic Therapies: NA  Discharge Instructions     Diet - low sodium heart healthy   Complete by: As directed    Discharge instructions   Complete by: As directed    Take all medications as prescribed by his/her mental healthcare provider. Report any adverse effects and or reactions from the medicines to your outpatient provider promptly. Do not engage in alcohol and or illegal drug use while on prescription medicines. In the event of worsening symptoms, call the crisis hotline, 911 and or go to the nearest ED for appropriate evaluation and treatment of symptoms. follow-up with your primary care provider for your other medical issues, concerns and or health care needs.   Increase activity slowly   Complete by: As directed       Allergies as of 01/14/2022       Reactions   Beef-derived Products Other (See Comments)   Religious preference   Chicken Protein    Religious preference   Fish-derived Products    Religious preference   Haloperidol Other (See Comments)   Patient experienced acute dystonia after receiving 10 mg dose of haldol. Listed in outside allergies from [facility]   Pork-derived Products    Religious Preference        Medication List     STOP taking these medications    benztropine 1 MG tablet Commonly known as: COGENTIN       TAKE these medications      Indication  ARIPiprazole 20 MG  tablet Commonly known as: ABILIFY Take 1 tablet (20 mg total) by mouth daily for 13 days. Start taking on: January 15, 2022 What changed:  medication strength how much to take  Indication: Schizoaffective Disorder   ARIPiprazole ER 400 MG Srer injection Commonly known as: ABILIFY MAINTENA Inject 2 mLs (400 mg total) into the muscle every 28 (twenty-eight) days. Next dose due on 02-10-2022. Start taking on: February 10, 2022 What changed: You were already taking a medication with the same name, and this prescription was added. Make sure you understand how and when to take each.  Indication: Schizoaffective Disorder   melatonin 5 MG Tabs Take 1 tablet (5 mg total) by mouth at bedtime.  Indication: Trouble Sleeping   traZODone 100 MG tablet Commonly known as: DESYREL Take 1 tablet (  100 mg total) by mouth at bedtime. What changed:  medication strength how much to take when to take this reasons to take this  Indication: Schizoaffective Disorder        [facility]. Schedule an appointment as soon as possible for a visit.   Why: Please call this provider to schedule appointments for therapy and medication management services, as they were closed for holiday prior to your discharge. Contact information: [place] [place] [address] [phone]                  Follow-up recommendations:   Activity:  as tolerated Diet:  heart healthy   Comments:  Prescriptions were given at discharge.  Patient is agreeable with the discharge plan.  Patient was given an opportunity to ask questions.  Patient appears to feel comfortable with discharge and denies any current suicidal or homicidal thoughts.    Patient is instructed prior to discharge to: Take all medications as prescribed by mental healthcare provider. Report any adverse effects and or reactions from the medicines to outpatient provider promptly. In the event of  worsening symptoms, patient is instructed to call the crisis hotline, 911 and or go to the nearest ED for appropriate evaluation and treatment of symptoms. Patient is to follow-up with primary care provider for other medical issues, concerns and or health care needs.   Signed: France Ravens, MD 01/14/2022, 9:32 AM

## 2022-01-14 NOTE — Discharge Instructions (Signed)
-  Follow-up with your outpatient psychiatric provider -instructions on appointment date, time, and address (location) are provided to you in discharge paperwork.  -Take your psychiatric medications as prescribed at discharge - instructions are provided to you in the discharge paperwork  -Follow-up with outpatient primary care doctor and other specialists -for management of preventative medicine and any chronic medical disease.  -Recommend abstinence from alcohol, tobacco, and other illicit drug use at discharge.   -If your psychiatric symptoms recur, worsen, or if you have side effects to your psychiatric medications, call your outpatient psychiatric provider, 911, [phone] or go to the nearest emergency department.  -If suicidal thoughts occur, call your outpatient psychiatric provider, 911, [phone] or go to the nearest emergency department.  Naloxone (Narcan) can help reverse an overdose when given to the victim quickly.  [place] offers free naloxone kits and instructions/training on its use.  Add naloxone to your first aid kit and you can help save a life.   Pick up your free kit at the following locations:   [place]:  [facility], [address] [place] [address] ([phone]) [facility] [address] [place] [id number] ([phone]) [facility] [address] [place] [address]  [place]: [facility] [address] [place] [address] ([phone]) [facility] [address] [place] [address] ([phone])  

## 2022-01-14 NOTE — Progress Notes (Signed)
   01/14/22 0800  Psych Admission Type (Psych Patients Only)  Admission Status Involuntary  Psychosocial Assessment  Patient Complaints Worrying  Eye Contact Fair  Facial Expression Anxious  Affect Appropriate to circumstance  Speech Logical/coherent  Interaction Assertive  Motor Activity Slow  Appearance/Hygiene Unremarkable  Behavior Characteristics Cooperative;Appropriate to situation  Mood Pleasant  Aggressive Behavior  Effect No apparent injury  Thought Process  Coherency Circumstantial  Content WDL  Delusions None reported or observed  Perception WDL  Hallucination None reported or observed  Judgment WDL  Confusion None  Danger to Self  Current suicidal ideation? Denies  Agreement Not to Harm Self Yes  Description of Agreement verbal  Danger to Others  Danger to Others None reported or observed

## 2022-01-14 NOTE — Discharge Summary (Signed)
Physician Discharge Summary Note  Patient:  Brent [name] is an 42 y.o., male MRN:  [id number] DOB:  [birth year] Patient phone:  [phone] (home)  Patient address:   [address] [place] [address],  Total Time spent with patient: 45 minutes  Date of Admission:  01/08/2022 Date of Discharge: 01/14/2022  Reason for Admission:   Brent [name] is a 42 y.o. male with a history of  schizophrenia and PTSD, who was initially admitted for inpatient psychiatric hospitalization on 01/08/2022 for management of psychosis in the setting of medication noncompliance from [place].    Principal Problem: Schizoaffective disorder [facility]) Discharge Diagnoses: Principal Problem:   Schizoaffective disorder ([place]) Active Problems:   PTSD (post-traumatic stress disorder)    Past Psychiatric History:  Previous Psych Diagnoses: schizophrenia, PTSD Prior inpatient psychiatric treatment: abilify, cogentin, atarax, trazodone Current/prior outpatient psychiatric treatment: bilify, cogentin, trazodone Current psychiatrist: Recently saw psychiatrist about 1 month ago.  He cannot recall the name but says the clinic is located on [address].   Previous hospitalizations 3x from 2017-2018, reports that due to depression   Psychiatric medication compliance history: poor   Current therapist: Denies, says his therapy is praying to God   History of suicide attempts: Denies  Past Medical History:  Past Medical History:  Diagnosis Date   PTSD (post-traumatic stress disorder)    Schizophrenia ([place])    Sleep disorder, unspecified    History reviewed. No pertinent surgical history. Family History:  Family History  Problem Relation Age of Onset   Mental illness Neg Hx    Family Psychiatric  History:  Medical: Diabetes, seizures, MI Psych: Denies Suicide: Denies Substance use family hx: Denies  Social History:  Social History   Substance and Sexual Activity  Alcohol Use No      Social History   Substance and Sexual Activity  Drug Use No    Social History   Socioeconomic History   Marital status: Single    Spouse name: Not on file   Number of children: Not on file   Years of education: Not on file   Highest education level: Not on file  Occupational History   Not on file  Tobacco Use   Smoking status: Never   Smokeless tobacco: Never  Substance and Sexual Activity   Alcohol use: No   Drug use: No   Sexual activity: Not on file  Other Topics Concern   Not on file  Social History Narrative   Not on file   Social Determinants of Health   Financial Resource Strain: Not on file  Food Insecurity: No Food Insecurity (01/08/2022)   Hunger Vital Sign    Worried About Running Out of Food in the Last Year: Never true    Ran Out of Food in the Last Year: Never true  Transportation Needs: No Transportation Needs (01/08/2022)   PRAPARE - Hydrologist (Medical): No    Lack of Transportation (Non-Medical): No  Physical Activity: Not on file  Stress: Not on file  Social Connections: Not on file    Hospital Course:   During the patient's hospitalization, patient had extensive initial psychiatric evaluation, and follow-up psychiatric evaluations every day.   Psychiatric diagnoses provided upon initial assessment:  Schizoaffective disorder, depressive type PTSD   Patient's psychiatric medications were adjusted on admission:  -Start Abilify 15 mg for psychosis   During the hospitalization, other adjustments were made to the patient's psychiatric medication regimen:  -Increased  Abilify to 20 mg daily x14 days for oral bridge -Gave Abilify maintena 400 mg on 01/13/22, should receive every 28 days   Gradually, patient started adjusting to milieu.   Patient's care was discussed during the interdisciplinary team meeting every day during the hospitalization.   The patient denies having side effects to prescribed psychiatric  medication.   The patient reports their target psychiatric symptoms of psychosis responded well to the psychiatric medications, and the patient reports overall benefit other psychiatric hospitalization. Supportive psychotherapy was provided to the patient. The patient also participated in regular group therapy while admitted.    Labs were reviewed with the patient, and abnormal results were discussed with the patient.   The patient denied having suicidal thoughts more than 48 hours prior to discharge.  Patient denies having homicidal thoughts.  Patient denies having auditory hallucinations.  Patient denies any visual hallucinations.  Patient denies having paranoid thoughts.   The patient is able to verbalize their individual safety plan to this provider.   It is recommended to the patient to continue psychiatric medications as prescribed, after discharge from the hospital.     It is recommended to the patient to follow up with your outpatient psychiatric provider and PCP.   Discussed with the patient, the impact of alcohol, drugs, tobacco have been there overall psychiatric and medical wellbeing, and total abstinence from substance use was recommended the patient.    Physical Findings: AIMS: 0  Musculoskeletal: Strength & Muscle Tone: within normal limits Gait & Station: normal Patient leans: N/A   Psychiatric Specialty Exam:  Presentation  General Appearance:  Appropriate for Environment; Casual   Eye Contact: Good   Speech: Clear and Coherent; Normal Rate   Speech Volume: Normal   Handedness: Right    Mood and Affect  Mood: Euthymic   Affect: Appropriate; Congruent    Thought Process  Thought Processes: Coherent; Goal Directed; Linear   Descriptions of Associations:Intact   Orientation:Full (Time, Place and Person)   Thought Content:Logical   History of Schizophrenia/Schizoaffective disorder:No   Duration of Psychotic Symptoms:Greater  than six months   Hallucinations:Hallucinations: None   Ideas of Reference:None   Suicidal Thoughts:Suicidal Thoughts: No   Homicidal Thoughts:Homicidal Thoughts: No    Sensorium  Memory: Immediate Good; Recent Good; Remote Good   Judgment: Fair   Insight: Fair    Executive Functions  Concentration: Good   Attention Span: Good   Recall: Good   Fund of Knowledge: Good   Language: Good    Psychomotor Activity  Psychomotor Activity: Psychomotor Activity: Normal    Assets  Assets: Communication Skills; Desire for Improvement; Physical Health    Sleep  Sleep: Sleep: Good     Physical Exam: Physical Exam Vitals and nursing note reviewed.  Constitutional:      Appearance: Normal appearance. He is normal weight.  HENT:     Head: Normocephalic and atraumatic.  Pulmonary:     Effort: Pulmonary effort is normal.  Neurological:     General: No focal deficit present.     Mental Status: He is oriented to person, place, and time.    Review of Systems  Respiratory:  Negative for shortness of breath.   Cardiovascular:  Negative for chest pain.  Gastrointestinal:  Negative for abdominal pain, constipation, diarrhea, heartburn, nausea and vomiting.  Neurological:  Negative for headaches.   Blood pressure 129/75, pulse 83, temperature 98 F (36.7 C), temperature source Oral, resp. rate 16, SpO2 100 %. There is no height or   weight on file to calculate BMI.   Social History   Tobacco Use  Smoking Status Never  Smokeless Tobacco Never   Tobacco Cessation:  N/A, patient does not currently use tobacco products   Blood Alcohol level:  Lab Results  Component Value Date   ETH <10 01/07/2022   ETH <5 08/19/2016    Metabolic Disorder Labs:  Lab Results  Component Value Date   HGBA1C 5.3 01/07/2022   MPG 105 01/07/2022   MPG 79.58 08/23/2016   Lab Results  Component Value Date   PROLACTIN 17.0 (H) 08/23/2016   PROLACTIN 17.0  (H) 02/02/2016   Lab Results  Component Value Date   CHOL 146 01/07/2022   TRIG 143 01/07/2022   HDL 35 (L) 01/07/2022   CHOLHDL 4.2 01/07/2022   VLDL 29 01/07/2022   LDLCALC 82 01/07/2022   LDLCALC 93 08/23/2016    See Psychiatric Specialty Exam and Suicide Risk Assessment completed by Attending Physician prior to discharge.  Discharge destination:  Home  Is patient on multiple antipsychotic therapies at discharge:  No   Has Patient had three or more failed trials of antipsychotic monotherapy by history:  No  Recommended Plan for Multiple Antipsychotic Therapies: NA  Discharge Instructions     Diet - low sodium heart healthy   Complete by: As directed    Discharge instructions   Complete by: As directed    Take all medications as prescribed by his/her mental healthcare provider. Report any adverse effects and or reactions from the medicines to your outpatient provider promptly. Do not engage in alcohol and or illegal drug use while on prescription medicines. In the event of worsening symptoms, call the crisis hotline, 911 and or go to the nearest ED for appropriate evaluation and treatment of symptoms. follow-up with your primary care provider for your other medical issues, concerns and or health care needs.   Increase activity slowly   Complete by: As directed       Allergies as of 01/14/2022       Reactions   Beef-derived Products Other (See Comments)   Religious preference   Chicken Protein    Religious preference   Fish-derived Products    Religious preference   Haloperidol Other (See Comments)   Patient experienced acute dystonia after receiving 10 mg dose of haldol. Listed in outside allergies from [facility]   Pork-derived Products    Religious Preference        Medication List     STOP taking these medications    benztropine 1 MG tablet Commonly known as: COGENTIN       TAKE these medications      Indication  ARIPiprazole 20 MG  tablet Commonly known as: ABILIFY Take 1 tablet (20 mg total) by mouth daily for 13 days. Start taking on: January 15, 2022 What changed:  medication strength how much to take  Indication: Schizoaffective Disorder   ARIPiprazole ER 400 MG Srer injection Commonly known as: ABILIFY MAINTENA Inject 2 mLs (400 mg total) into the muscle every 28 (twenty-eight) days. Next dose due on 02-10-2022. Start taking on: February 10, 2022 What changed: You were already taking a medication with the same name, and this prescription was added. Make sure you understand how and when to take each.  Indication: Schizoaffective Disorder   melatonin 5 MG Tabs Take 1 tablet (5 mg total) by mouth at bedtime.  Indication: Trouble Sleeping   traZODone 100 MG tablet Commonly known as: DESYREL Take 1 tablet (  100 mg total) by mouth at bedtime. What changed:  medication strength how much to take when to take this reasons to take this  Indication: Schizoaffective Disorder        [facility]. Schedule an appointment as soon as possible for a visit.   Why: Please call this provider to schedule appointments for therapy and medication management services, as they were closed for holiday prior to your discharge. Contact information: [address] [place] [address] [phone]                  Follow-up recommendations:   Activity:  as tolerated Diet:  heart healthy   Comments:  Prescriptions were given at discharge.  Patient is agreeable with the discharge plan.  Patient was given an opportunity to ask questions.  Patient appears to feel comfortable with discharge and denies any current suicidal or homicidal thoughts.    Patient is instructed prior to discharge to: Take all medications as prescribed by mental healthcare provider. Report any adverse effects and or reactions from the medicines to outpatient provider promptly. In the event of  worsening symptoms, patient is instructed to call the crisis hotline, 911 and or go to the nearest ED for appropriate evaluation and treatment of symptoms. Patient is to follow-up with primary care provider for other medical issues, concerns and or health care needs.   Signed: France Ravens, MD 01/14/2022, 9:32 AM

## 2022-01-14 NOTE — Progress Notes (Signed)
Discharge Note:  Patient denies SI/HI/AVH at this time. Discharge instructions, AVS, prescriptions, and transition recor gone over with patient. Patient agrees to comply with medication management, follow-up visit, and outpatient therapy. Patient belongings returned to patient. Patient questions and concerns addressed and answered. Patient ambulatory off unit. Patient discharged to home with family

## 2022-01-14 NOTE — Progress Notes (Signed)
   01/14/22 0531  15 Minute Checks  Location Bedroom  Visual Appearance Calm  Behavior Sleeping  Sleep (Behavioral Health Patients Only)  Calculate sleep? (Click Yes once per 24 hr at 0600 safety check) Yes  Documented sleep last 24 hours 7.25

## 2022-01-14 NOTE — Progress Notes (Signed)
  [facility] Adult Case Management Discharge Plan :  Will you be returning to the same living situation after discharge:  Yes,  home At discharge, do you have transportation home?: Yes,  family will pick up Do you have the ability to pay for your medications: Yes,  insurance   Release of information consent forms completed and in the chart;  Patient's signature needed at discharge.  Patient to Follow up at:  [facility]. Schedule an appointment as soon as possible for a visit.   Why: Please call this provider to schedule appointments for therapy and medication management services, as they were closed for holiday prior to your discharge. Contact information: [place] [address] [phone]                 Next level of care provider has access to [facility] and Suicide Prevention discussed: Yes,  done with patient, unable to get a hold of wife after multiple attempts.  Dr. Was able to talk with wife and did safety planning     Has patient been referred to the Quitline?: N/A patient is not a smoker  Patient has been referred for addiction treatment: [place], LCSW 01/14/2022, 9:53 AM

## 2022-01-14 NOTE — Progress Notes (Signed)
The focus of this group is to help patients establish daily goals to achieve during treatment and discuss how the patient can incorporate goal setting into their daily lives to aide in recovery.  Pt attended the morning goals group and responded to all discussion prompts from the [place]. Pt shared that his daily goal was to discharge home, which he recently learned he would be doing and was quite happy about.  "I have no bad feelings and, if I do, I will spend time with my family and exercise until I feel better."  Pt's affect was appropriate and he also completed his self inventory sheet, which he submitted to the [place].

## 2024-02-12 ENCOUNTER — Other Ambulatory Visit (HOSPITAL_COMMUNITY): Payer: Self-pay
